# Patient Record
Sex: Male | Born: 1975
Health system: Southern US, Community
[De-identification: ages and names within clinical notes are randomized; demographics above are authoritative.]

## PROBLEM LIST (undated history)

## (undated) DIAGNOSIS — K219 Gastro-esophageal reflux disease without esophagitis: Secondary | ICD-10-CM

## (undated) DIAGNOSIS — I639 Cerebral infarction, unspecified: Secondary | ICD-10-CM

## (undated) DIAGNOSIS — E119 Type 2 diabetes mellitus without complications: Secondary | ICD-10-CM

## (undated) DIAGNOSIS — I10 Essential (primary) hypertension: Secondary | ICD-10-CM

## (undated) DIAGNOSIS — F32A Depression, unspecified: Secondary | ICD-10-CM

## (undated) HISTORY — DX: Cerebral infarction, unspecified: I63.9

## (undated) HISTORY — DX: Depression, unspecified: F32.A

## (undated) HISTORY — DX: Type 2 diabetes mellitus without complications: E11.9

## (undated) HISTORY — DX: Gastro-esophageal reflux disease without esophagitis: K21.9

---

## 1998-01-17 HISTORY — PX: OTHER SURGICAL HISTORY: SHX169

## 2016-01-18 HISTORY — PX: OTHER SURGICAL HISTORY: SHX169

## 2019-04-23 DIAGNOSIS — M25561 Pain in right knee: Secondary | ICD-10-CM | POA: Diagnosis not present

## 2019-04-23 DIAGNOSIS — M79604 Pain in right leg: Secondary | ICD-10-CM | POA: Diagnosis not present

## 2019-04-23 DIAGNOSIS — M7989 Other specified soft tissue disorders: Secondary | ICD-10-CM | POA: Diagnosis not present

## 2019-04-23 DIAGNOSIS — Z7982 Long term (current) use of aspirin: Secondary | ICD-10-CM | POA: Diagnosis not present

## 2019-04-23 DIAGNOSIS — E785 Hyperlipidemia, unspecified: Secondary | ICD-10-CM | POA: Diagnosis not present

## 2019-04-23 DIAGNOSIS — E119 Type 2 diabetes mellitus without complications: Secondary | ICD-10-CM | POA: Diagnosis not present

## 2019-04-23 DIAGNOSIS — F17210 Nicotine dependence, cigarettes, uncomplicated: Secondary | ICD-10-CM | POA: Diagnosis not present

## 2019-04-23 DIAGNOSIS — Z79899 Other long term (current) drug therapy: Secondary | ICD-10-CM | POA: Diagnosis not present

## 2019-04-23 DIAGNOSIS — R2241 Localized swelling, mass and lump, right lower limb: Secondary | ICD-10-CM | POA: Diagnosis not present

## 2019-04-23 DIAGNOSIS — I10 Essential (primary) hypertension: Secondary | ICD-10-CM | POA: Diagnosis not present

## 2019-04-23 DIAGNOSIS — Z7984 Long term (current) use of oral hypoglycemic drugs: Secondary | ICD-10-CM | POA: Diagnosis not present

## 2019-05-15 DIAGNOSIS — R079 Chest pain, unspecified: Secondary | ICD-10-CM | POA: Diagnosis not present

## 2019-05-16 DIAGNOSIS — I517 Cardiomegaly: Secondary | ICD-10-CM | POA: Diagnosis not present

## 2019-05-20 DIAGNOSIS — F331 Major depressive disorder, recurrent, moderate: Secondary | ICD-10-CM | POA: Diagnosis not present

## 2019-06-12 ENCOUNTER — Ambulatory Visit: Payer: Self-pay | Admitting: Family Medicine

## 2019-07-08 ENCOUNTER — Emergency Department (HOSPITAL_COMMUNITY): Payer: BC Managed Care – PPO

## 2019-07-08 ENCOUNTER — Inpatient Hospital Stay (HOSPITAL_COMMUNITY)
Admission: EM | Admit: 2019-07-08 | Discharge: 2019-07-09 | DRG: 062 | Discharge status: Against Medical Advice | Payer: BC Managed Care – PPO | Attending: Neurology | Admitting: Neurology

## 2019-07-08 ENCOUNTER — Inpatient Hospital Stay (HOSPITAL_COMMUNITY): Payer: BC Managed Care – PPO

## 2019-07-08 DIAGNOSIS — R4781 Slurred speech: Secondary | ICD-10-CM | POA: Diagnosis not present

## 2019-07-08 DIAGNOSIS — R519 Headache, unspecified: Secondary | ICD-10-CM | POA: Diagnosis not present

## 2019-07-08 DIAGNOSIS — T7840XA Allergy, unspecified, initial encounter: Secondary | ICD-10-CM | POA: Diagnosis not present

## 2019-07-08 DIAGNOSIS — E876 Hypokalemia: Secondary | ICD-10-CM | POA: Diagnosis not present

## 2019-07-08 DIAGNOSIS — Z20822 Contact with and (suspected) exposure to covid-19: Secondary | ICD-10-CM | POA: Diagnosis not present

## 2019-07-08 DIAGNOSIS — I639 Cerebral infarction, unspecified: Secondary | ICD-10-CM | POA: Diagnosis not present

## 2019-07-08 DIAGNOSIS — R52 Pain, unspecified: Secondary | ICD-10-CM | POA: Diagnosis not present

## 2019-07-08 DIAGNOSIS — F17210 Nicotine dependence, cigarettes, uncomplicated: Secondary | ICD-10-CM | POA: Diagnosis not present

## 2019-07-08 DIAGNOSIS — E785 Hyperlipidemia, unspecified: Secondary | ICD-10-CM | POA: Diagnosis not present

## 2019-07-08 DIAGNOSIS — G8194 Hemiplegia, unspecified affecting left nondominant side: Secondary | ICD-10-CM | POA: Diagnosis not present

## 2019-07-08 DIAGNOSIS — G4489 Other headache syndrome: Secondary | ICD-10-CM | POA: Diagnosis not present

## 2019-07-08 DIAGNOSIS — R29810 Facial weakness: Secondary | ICD-10-CM | POA: Diagnosis not present

## 2019-07-08 DIAGNOSIS — R29818 Other symptoms and signs involving the nervous system: Secondary | ICD-10-CM | POA: Diagnosis not present

## 2019-07-08 DIAGNOSIS — R000 Tachycardia, unspecified: Secondary | ICD-10-CM | POA: Diagnosis not present

## 2019-07-08 DIAGNOSIS — I10 Essential (primary) hypertension: Secondary | ICD-10-CM | POA: Diagnosis not present

## 2019-07-08 DIAGNOSIS — R9431 Abnormal electrocardiogram [ECG] [EKG]: Secondary | ICD-10-CM | POA: Diagnosis not present

## 2019-07-08 DIAGNOSIS — H538 Other visual disturbances: Secondary | ICD-10-CM | POA: Diagnosis not present

## 2019-07-08 DIAGNOSIS — E119 Type 2 diabetes mellitus without complications: Secondary | ICD-10-CM | POA: Diagnosis present

## 2019-07-08 HISTORY — DX: Essential (primary) hypertension: I10

## 2019-07-08 LAB — CBC
HCT: 45 % (ref 39.0–52.0)
Hemoglobin: 14.3 g/dL (ref 13.0–17.0)
MCH: 25.8 pg — ABNORMAL LOW (ref 26.0–34.0)
MCHC: 31.8 g/dL (ref 30.0–36.0)
MCV: 81.1 fL (ref 80.0–100.0)
Platelets: 315 10*3/uL (ref 150–400)
RBC: 5.55 MIL/uL (ref 4.22–5.81)
RDW: 15.3 % (ref 11.5–15.5)
WBC: 7.8 10*3/uL (ref 4.0–10.5)
nRBC: 0 % (ref 0.0–0.2)

## 2019-07-08 LAB — COMPREHENSIVE METABOLIC PANEL
ALT: 33 U/L (ref 0–44)
AST: 24 U/L (ref 15–41)
Albumin: 3.7 g/dL (ref 3.5–5.0)
Alkaline Phosphatase: 67 U/L (ref 38–126)
Anion gap: 11 (ref 5–15)
BUN: 9 mg/dL (ref 6–20)
CO2: 23 mmol/L (ref 22–32)
Calcium: 9 mg/dL (ref 8.9–10.3)
Chloride: 103 mmol/L (ref 98–111)
Creatinine, Ser: 1.12 mg/dL (ref 0.61–1.24)
GFR calc Af Amer: >60 mL/min (ref >60)
GFR calc non Af Amer: >60 mL/min (ref >60)
Glucose, Bld: 167 mg/dL — ABNORMAL HIGH (ref 70–99)
Potassium: 4.1 mmol/L (ref 3.5–5.1)
Sodium: 137 mmol/L (ref 135–145)
Total Bilirubin: 0.8 mg/dL (ref 0.3–1.2)
Total Protein: 6.8 g/dL (ref 6.5–8.1)

## 2019-07-08 LAB — PROTIME-INR
INR: 1 (ref 0.8–1.2)
Prothrombin Time: 12.9 seconds (ref 11.4–15.2)

## 2019-07-08 LAB — I-STAT CHEM 8, ED
BUN: 11 mg/dL (ref 6–20)
Calcium, Ion: 1.12 mmol/L — ABNORMAL LOW (ref 1.15–1.40)
Chloride: 101 mmol/L (ref 98–111)
Creatinine, Ser: 1.1 mg/dL (ref 0.61–1.24)
Glucose, Bld: 172 mg/dL — ABNORMAL HIGH (ref 70–99)
HCT: 45 % (ref 39.0–52.0)
Hemoglobin: 15.3 g/dL (ref 13.0–17.0)
Potassium: 3.3 mmol/L — ABNORMAL LOW (ref 3.5–5.1)
Sodium: 140 mmol/L (ref 135–145)
TCO2: 26 mmol/L (ref 22–32)

## 2019-07-08 LAB — CBG MONITORING, ED: Glucose-Capillary: 157 mg/dL — ABNORMAL HIGH (ref 70–99)

## 2019-07-08 LAB — DIFFERENTIAL
Abs Immature Granulocytes: 0.08 10*3/uL — ABNORMAL HIGH (ref 0.00–0.07)
Basophils Absolute: 0 10*3/uL (ref 0.0–0.1)
Basophils Relative: 1 %
Eosinophils Absolute: 0.2 10*3/uL (ref 0.0–0.5)
Eosinophils Relative: 2 %
Immature Granulocytes: 1 %
Lymphocytes Relative: 26 %
Lymphs Abs: 2 10*3/uL (ref 0.7–4.0)
Monocytes Absolute: 0.5 10*3/uL (ref 0.1–1.0)
Monocytes Relative: 7 %
Neutro Abs: 5 10*3/uL (ref 1.7–7.7)
Neutrophils Relative %: 63 %

## 2019-07-08 LAB — APTT: aPTT: 30 seconds (ref 24–36)

## 2019-07-08 LAB — ETHANOL: Alcohol, Ethyl (B): <10 mg/dL (ref <10)

## 2019-07-08 MED ORDER — STROKE: EARLY STAGES OF RECOVERY BOOK: Freq: Once | Status: DC

## 2019-07-08 MED ORDER — ACETAMINOPHEN 160 MG/5ML PO SOLN: 650.0000 mg | ORAL | Status: DC | PRN

## 2019-07-08 MED ORDER — NICARDIPINE HCL IN NACL 20-0.86 MG/200ML-% IV SOLN: 0.0000 mg/h | INTRAVENOUS | Status: DC | PRN

## 2019-07-08 MED ORDER — ALTEPLASE (STROKE) FULL DOSE INFUSION
90.0000 mg | Freq: Once | INTRAVENOUS | Status: AC
  Administered 2019-07-08: 90 mg via INTRAVENOUS
  Filled 2019-07-08: qty 100

## 2019-07-08 MED ORDER — IOHEXOL 350 MG/ML SOLN
100.0000 mL | Freq: Once | INTRAVENOUS | Status: AC | PRN
  Administered 2019-07-08: 100 mL via INTRAVENOUS

## 2019-07-08 MED ORDER — ACETAMINOPHEN 325 MG PO TABS: 650.0000 mg | ORAL_TABLET | ORAL | Status: DC | PRN

## 2019-07-08 MED ORDER — LABETALOL HCL 5 MG/ML IV SOLN: 10.0000 mg | Freq: Once | INTRAVENOUS | Status: DC | PRN

## 2019-07-08 MED ORDER — SODIUM CHLORIDE 0.9 % IV SOLN: 50.0000 mL/h | INTRAVENOUS | Status: DC

## 2019-07-08 MED ORDER — DIPHENHYDRAMINE HCL 50 MG/ML IJ SOLN
INTRAMUSCULAR | Status: AC
  Filled 2019-07-08: qty 1

## 2019-07-08 MED ORDER — ONDANSETRON HCL 4 MG/2ML IJ SOLN
INTRAMUSCULAR | Status: AC
  Filled 2019-07-08: qty 2

## 2019-07-08 MED ORDER — SODIUM CHLORIDE 0.9 % IV SOLN
50.0000 mL | Freq: Once | INTRAVENOUS | Status: AC
  Administered 2019-07-08: 50 mL via INTRAVENOUS

## 2019-07-08 MED ORDER — PANTOPRAZOLE SODIUM 40 MG IV SOLR
40.0000 mg | Freq: Every day | INTRAVENOUS | Status: DC
  Administered 2019-07-09: 40 mg via INTRAVENOUS
  Filled 2019-07-08: qty 40

## 2019-07-08 MED ORDER — DIPHENHYDRAMINE HCL 50 MG/ML IJ SOLN
50.0000 mg | Freq: Once | INTRAMUSCULAR | Status: AC
  Administered 2019-07-09: 50 mg via INTRAVENOUS

## 2019-07-08 MED ORDER — ACETAMINOPHEN 650 MG RE SUPP: 650.0000 mg | RECTAL | Status: DC | PRN

## 2019-07-08 NOTE — ED Triage Notes (Signed)
Pt presents to ED BIB [facility]. Pt presents as COD STROKE. Pt L arm and leg weakness, l facial droop, dysarthria. LKW - 2130.

## 2019-07-08 NOTE — H&P (Signed)
Neurology H&P  CC: Left-sided weakness  History is obtained from: Patient  HPI: Samuel Maldonado is a 44 y.o. male with a history of diabetes and hypertension who presents with left-sided weakness that started abruptly at 9:24 PM.  He has had a headache all day, but did not have any neurological symptoms on arriving home as witnessed by both the patient and his wife.  He called out to her that his vision had gone out in his left eye, and when his wife responded he was staggering and therefore 911 was called.  He states that the vision returned fairly quickly, but he continued to have difficulty with his left side and EMS activated a code stroke.  On arrival to the emergency department, he had mild left arm and leg weakness with drift.  On ambulation, he was able with one person assist, but was clearly unsteady.  I discussed risk and benefits of TPA with the patient, however he wanted to involve his wife.  She was called on the phone, but the discussion was fairly extended causing some delay.   LKW: 9:20 PM tpa given?:  Yes Modified Rankin Scale: 0-Completely asymptomatic and back to baseline post- stroke  ROS: A 14 point ROS was performed and is negative except as noted in the HPI.   PMH: htn DMII   FHx: No hx stroke  Social History: + for smoking, does not drink  Exam: Current vital signs: BP (!) 152/109   Pulse 90   Temp (!) 97 F (36.1 C)   Resp 15   Wt 119.9 kg   SpO2 98%  Vital signs in last 24 hours: Temp:  [97 F (36.1 C)] 97 F (36.1 C) (06/21 2316) Pulse Rate:  [88-93] 90 (06/21 2316) Resp:  [11-20] 15 (06/21 2316) BP: (139-158)/(98-109) 152/109 (06/21 2316) SpO2:  [95 %-99 %] 98 % (06/21 2316) Weight:  [119.9 kg] 119.9 kg (06/21 2241)  Physical Exam  Constitutional: Appears well-developed and well-nourished.  Psych: Affect appropriate to situation Eyes: No scleral injection HENT: No OP obstrucion Head: Normocephalic.  Cardiovascular: Normal rate and  regular rhythm.  Respiratory: Effort normal and breath sounds normal to anterior ascultation GI: Soft.  No distension. There is no tenderness.  Skin: WDI  Neuro: Mental Status: Patient is awake, alert, oriented to person, place, month, year, and situation. Patient is able to give a clear and coherent history. No signs of aphasia or neglect Cranial Nerves: II: Visual Fields are full. Pupils are equal, round, and reactive to light.   III,IV, VI: EOMI without ptosis or diploplia.  V: Facial sensation is symmetric to temperature VII: Facial movement is with mild lag on left smile VIII: hearing is intact to voice X: Uvula elevates symmetrically XI: Shoulder shrug is symmetric. XII: tongue is midline without atrophy or fasciculations.  Motor: Tone is normal. Bulk is normal. 5/5 strength was present on the right, he has drift in left arm and leg with 4/5 weakness Sensory: Sensation is symmetric to light touch and temperature in the arms and legs. Cerebellar: FNF and HKS are intact bilaterally   I have reviewed labs in epic and the results pertinent to this consultation are: cmp - unremarkable  I have reviewed the images obtained:CT/CTA - negative  Primary Diagnosis:  Cerebral Infarction due to occlusion of unspecified artery   Secondary Diagnosis: Essential (primary) hypertension and Type 2 diabetes mellitus w/o complications   Impression: 44 year old male with acute left-sided weakness.  I do wonder about thalamic infarct  given his initial complaint of visual change, however MRI could be helpful in determining location.  His improving symptoms are reassuring, but at the time of TPA administration, he still had left-sided symptoms which could impair his ability to function in his vocation (e.g. disabling symptom).   Recommendations: - HgbA1c, fasting lipid panel - MRI, MRA  of the brain without contrast - Frequent neuro checks - Echocardiogram - Carotid dopplers - Prophylactic  therapy-Antiplatelet med: None for 24 hours - Risk factor modification - Telemetry monitoring - PT consult, OT consult, Speech consult - SSI for DM - Stroke team to follow    This patient is critically ill and at significant risk of neurological worsening, death and care requires constant monitoring of vital signs, hemodynamics,respiratory and cardiac monitoring, neurological assessment, discussion with family, other specialists and medical decision making of high complexity. I spent 45 minutes of neurocritical care time  in the care of  this patient. This was time spent independent of any time provided by nurse practitioner or PA.  Roland Rack, MD [facility] [phone]  If 7pm- 7am, please page neurology on call as listed in [place]. 07/08/2019  11:27 PM

## 2019-07-08 NOTE — Progress Notes (Signed)
Pharmacist Code Stroke Response  Notified to mix tPA at 2240 by Dr. Amada Jupiter Delivered tPA to RN at 2244 Dose administered at 2246  tPA dose = 9mg  bolus over 1 minute followed by 82mg  for a total dose of 90mg  over 1 hour  Issues/delays encountered (if applicable): Patient consent   , PharmD., BCPS, BCCCP Clinical Pharmacist Clinical phone for 07/08/19 until 11:30pm: [phone] If after 11:30pm, please refer to [facility] for unit-specific pharmacist

## 2019-07-08 NOTE — ED Provider Notes (Signed)
[facility] EMERGENCY DEPARTMENT Provider Note   CSN: [id number] Arrival date & time: 07/08/19  2210  An emergency department physician performed an initial assessment on this suspected stroke patient at 2211.  History Chief Complaint  Patient presents with  . Code Stroke    Samuel Maldonado is a 44 y.o. male.  44 year old male with history of hypertension who had a headache all day on the left side behind his left ear.  Patient states that he had complete loss of vision in his left eye for 4 to 5 minutes that has since resolved but at that same time he also had slurred speech per his spouse and this is now resolved.  No other focal deficits were noted.  Airway intact.  Neurology present on patient arrival and patient taken directly to the CT scanner for further evaluation.  The history is provided by the patient and the EMS personnel.  Illness Location:  Head Quality:  Ache, vision changes, slurred speech Severity:  Moderate Onset quality:  Gradual Timing:  Constant Progression:  Unchanged Chronicity:  New Context:  All day headache follow-up with neurologic symptoms concerning for stroke brought in as a code stroke Relieved by:  Nothing Worsened by:  Nothing Ineffective treatments:  Nothing Associated symptoms: no chest pain, no loss of consciousness, no nausea, no shortness of breath and no vomiting        History reviewed. No pertinent past medical history.  Patient Active Problem List   Diagnosis Date Noted  . Stroke (cerebrum) (HCC) 07/08/2019    History reviewed. No pertinent surgical history.     History reviewed. No pertinent family history.  Social History   Tobacco Use  . Smoking status: Not on file  Substance Use Topics  . Alcohol use: Not on file  . Drug use: Not on file    Home Medications Prior to Admission medications   Medication Sig Start Date End Date Taking? Authorizing Provider  acetaminophen (TYLENOL) 325 MG  tablet Take 650 mg by mouth every 6 (six) hours as needed for mild pain.   Yes [provider]  aspirin 81 MG EC tablet Take 81 mg by mouth daily. 02/21/19  Yes [provider]  atorvastatin (LIPITOR) 20 MG tablet Take 20 mg by mouth daily. 05/01/19  Yes [provider]  glipiZIDE (GLUCOTROL) 10 MG tablet Take 10 mg by mouth daily. 05/01/19  Yes [provider]  lisinopril (ZESTRIL) 10 MG tablet Take 10 mg by mouth daily. 05/01/19  Yes [provider]  metFORMIN (GLUCOPHAGE) 500 MG tablet Take 500 mg by mouth 2 (two) times daily. 05/01/19  Yes [provider]  pseudoephedrine-acetaminophen (TYLENOL SINUS) 30-500 MG TABS tablet Take 1 tablet by mouth every 4 (four) hours as needed for congestion.   Yes [provider]  sitaGLIPtin (JANUVIA) 100 MG tablet Take 100 mg by mouth daily at 12 noon. 08/08/18 08/08/19 Yes [provider]  tadalafil (CIALIS) 20 MG tablet Take 20 mg by mouth daily as needed for erectile dysfunction.   Yes [provider]    Allergies    Patient has no known allergies.  Review of Systems   Review of Systems  Eyes: Positive for visual disturbance.  Respiratory: Negative for shortness of breath.   Cardiovascular: Negative for chest pain.  Gastrointestinal: Negative for nausea and vomiting.  Neurological: Positive for speech difficulty and numbness. Negative for loss of consciousness.  All other systems reviewed and are negative.   Physical Exam  Updated Vital Signs BP (!) 152/101   Pulse (!) 101   Temp 98.4 F (36.9 C) (Oral)   Resp 20   Wt 119.9 kg   SpO2 96%   Physical Exam Vitals and nursing note reviewed.  Constitutional:      Appearance: He is well-developed.  HENT:     Head: Normocephalic and atraumatic.     Right Ear: External ear normal.     Left Ear: External ear normal.     Nose: Nose normal.  Eyes:     Extraocular Movements: Extraocular movements intact.      Conjunctiva/sclera: Conjunctivae normal.  Cardiovascular:     Rate and Rhythm: Normal rate and regular rhythm.     Heart sounds: No murmur heard.   Pulmonary:     Effort: Pulmonary effort is normal. No respiratory distress.     Breath sounds: Normal breath sounds.  Abdominal:     Palpations: Abdomen is soft.     Tenderness: There is no abdominal tenderness.  Musculoskeletal:     Cervical back: Neck supple.  Skin:    General: Skin is warm and dry.  Neurological:     General: No focal deficit present.     Mental Status: He is alert and oriented to person, place, and time.     ED Results / Procedures / Treatments   Labs (all labs ordered are listed, but only abnormal results are displayed) Labs Reviewed  CBC - Abnormal; Notable for the following components:      Result Value   MCH 25.8 (*)    All other components within normal limits  DIFFERENTIAL - Abnormal; Notable for the following components:   Abs Immature Granulocytes 0.08 (*)    All other components within normal limits  COMPREHENSIVE METABOLIC PANEL - Abnormal; Notable for the following components:   Glucose, Bld 167 (*)    All other components within normal limits  URINALYSIS, ROUTINE W REFLEX MICROSCOPIC - Abnormal; Notable for the following components:   Hgb urine dipstick SMALL (*)    Protein, ur 30 (*)    All other components within normal limits  CBC - Abnormal; Notable for the following components:   RBC 5.82 (*)    MCH 25.9 (*)    All other components within normal limits  LIPID PANEL - Abnormal; Notable for the following components:   HDL 39 (*)    LDL Cholesterol 103 (*)    All other components within normal limits  I-STAT CHEM 8, ED - Abnormal; Notable for the following components:   Potassium 3.3 (*)    Glucose, Bld 172 (*)    Calcium, Ion 1.12 (*)    All other components within normal limits  CBG MONITORING, ED - Abnormal; Notable for the following components:   Glucose-Capillary 157 (*)    All  other components within normal limits  SARS CORONAVIRUS 2 BY RT PCR (HOSPITAL ORDER, PERFORMED IN [facility])  MRSA PCR SCREENING  ETHANOL  PROTIME-INR  APTT  RAPID URINE DRUG SCREEN, HOSP PERFORMED  HIV ANTIBODY (ROUTINE TESTING W REFLEX)  HEMOGLOBIN A1C    EKG EKG Interpretation  Date/Time:  Monday July 08 2019 22:40:23 EDT Ventricular Rate:  94 PR Interval:    QRS Duration: 88 QT Interval:  360 QTC Calculation: 451 R Axis:   60 Text Interpretation: Sinus rhythm Nonspecific T wave abnormality No old tracing to compare Confirmed by Dione Booze ([id number]) on 07/09/2019 12:21:17 AM   Radiology CT Code Stroke CTA  Head W/WO contrast  Result Date: 07/08/2019 CLINICAL DATA:  Slurred speech and headache. Left-sided vision disturbance. Code stroke. EXAM: CT HEAD WITHOUT CONTRAST CT ANGIOGRAPHY OF THE HEAD AND NECK TECHNIQUE: Contiguous axial images were obtained from the base of the skull through the vertex without intravenous contrast. Multidetector CT imaging of the head and neck was performed using the standard protocol during bolus administration of intravenous contrast. Multiplanar CT image reconstructions and MIPs were obtained to evaluate the vascular anatomy. Carotid stenosis measurements (when applicable) are obtained utilizing NASCET criteria, using the distal internal carotid diameter as the denominator. COMPARISON:  None. FINDINGS: CT HEAD FINDINGS Brain: There is no mass, hemorrhage or extra-axial collection. The size and configuration of the ventricles and extra-axial CSF spaces are normal. The brain parenchyma is normal, without evidence of acute or chronic infarction. Vascular: No abnormal hyperdensity of the major intracranial arteries or dural venous sinuses. No intracranial atherosclerosis. Skull: The visualized skull base, calvarium and extracranial soft tissues are normal. Sinuses/Orbits: No fluid levels or advanced mucosal thickening of the visualized paranasal  sinuses. No mastoid or middle ear effusion. The orbits are normal. ASPECTS ([place] Stroke Program Early CT Score) - Ganglionic level infarction (caudate, lentiform nuclei, internal capsule, insula, M1-M3 cortex): 7 - Supraganglionic infarction (M4-M6 cortex): 3 Total score (0-10 with 10 being normal): 10 CTA NECK FINDINGS SKELETON: There is no bony spinal canal stenosis. No lytic or blastic lesion. OTHER NECK: Normal pharynx, larynx and major salivary glands. No cervical lymphadenopathy. Unremarkable thyroid gland. UPPER CHEST: No pneumothorax or pleural effusion. No nodules or masses. AORTIC ARCH: There is no calcific atherosclerosis of the aortic arch. There is no aneurysm, dissection or hemodynamically significant stenosis of the visualized portion of the aorta. Conventional 3 vessel aortic branching pattern. The visualized proximal subclavian arteries are widely patent. RIGHT CAROTID SYSTEM: Normal without aneurysm, dissection or stenosis. LEFT CAROTID SYSTEM: Normal without aneurysm, dissection or stenosis. VERTEBRAL ARTERIES: Left dominant configuration. Both origins are clearly patent. There is no dissection, occlusion or flow-limiting stenosis to the skull base (V1-V3 segments). CTA HEAD FINDINGS POSTERIOR CIRCULATION: --Vertebral arteries: Normal V4 segments. --Inferior cerebellar arteries: Normal. --Basilar artery: Normal. --Superior cerebellar arteries: Normal. --Posterior cerebral arteries (PCA): Normal. ANTERIOR CIRCULATION: --Intracranial internal carotid arteries: Normal. --Anterior cerebral arteries (ACA): Normal. Both A1 segments are present. Patent anterior communicating artery (a-comm). --Middle cerebral arteries (MCA): Normal. VENOUS SINUSES: As permitted by contrast timing, patent. ANATOMIC VARIANTS: None Review of the MIP images confirms the above findings. IMPRESSION: 1. No intracranial hemorrhage. ASPECTS is 10. 2. No emergent large vessel occlusion or high-grade stenosis of the head or  neck. These results were communicated to Dr. Ritta Slot at 10:42 pm on 07/08/2019 by text page via the [facility] messaging system. Electronically Signed   By: Deatra Robinson M.D.   On: 07/08/2019 22:41   CT Head Wo Contrast  Result Date: 07/09/2019 CLINICAL DATA:  Status post tPA.  New onset blurry vision. EXAM: CT HEAD WITHOUT CONTRAST TECHNIQUE: Contiguous axial images were obtained from the base of the skull through the vertex without intravenous contrast. COMPARISON:  None. FINDINGS: Brain: There is no mass, hemorrhage or extra-axial collection. The size and configuration of the ventricles and extra-axial CSF spaces are normal. The brain parenchyma is normal, without acute or chronic infarction. 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 Skull: The visualized skull base, calvarium and extracranial soft tissues are normal. Sinuses/Orbits: No fluid levels or advanced mucosal thickening of the visualized paranasal sinuses. No mastoid  or middle ear effusion. The orbits are normal. IMPRESSION: Normal head CT. Incidentally noted right cerebellar developmental venous anomaly. Electronically Signed   By: Deatra RobinsonKevin  Herman M.D.   On: 07/09/2019 00:22   CT Code Stroke CTA Neck W/WO contrast  Result Date: 07/08/2019 CLINICAL DATA:  Slurred speech and headache. Left-sided vision disturbance. Code stroke. EXAM: CT HEAD WITHOUT CONTRAST CT ANGIOGRAPHY OF THE HEAD AND NECK TECHNIQUE: Contiguous axial images were obtained from the base of the skull through the vertex without intravenous contrast. Multidetector CT imaging of the head and neck was performed using the standard protocol during bolus administration of intravenous contrast. Multiplanar CT image reconstructions and MIPs were obtained to evaluate the vascular anatomy. Carotid stenosis measurements (when applicable) are obtained utilizing NASCET criteria, using the distal internal carotid diameter as the denominator. COMPARISON:  None.  FINDINGS: CT HEAD FINDINGS Brain: There is no mass, hemorrhage or extra-axial collection. The size and configuration of the ventricles and extra-axial CSF spaces are normal. The brain parenchyma is normal, without evidence of acute or chronic infarction. Vascular: No abnormal hyperdensity of the major intracranial arteries or dural venous sinuses. No intracranial atherosclerosis. Skull: The visualized skull base, calvarium and extracranial soft tissues are normal. Sinuses/Orbits: No fluid levels or advanced mucosal thickening of the visualized paranasal sinuses. No mastoid or middle ear effusion. The orbits are normal. ASPECTS ([place] Stroke Program Early CT Score) - Ganglionic level infarction (caudate, lentiform nuclei, internal capsule, insula, M1-M3 cortex): 7 - Supraganglionic infarction (M4-M6 cortex): 3 Total score (0-10 with 10 being normal): 10 CTA NECK FINDINGS SKELETON: There is no bony spinal canal stenosis. No lytic or blastic lesion. OTHER NECK: Normal pharynx, larynx and major salivary glands. No cervical lymphadenopathy. Unremarkable thyroid gland. UPPER CHEST: No pneumothorax or pleural effusion. No nodules or masses. AORTIC ARCH: There is no calcific atherosclerosis of the aortic arch. There is no aneurysm, dissection or hemodynamically significant stenosis of the visualized portion of the aorta. Conventional 3 vessel aortic branching pattern. The visualized proximal subclavian arteries are widely patent. RIGHT CAROTID SYSTEM: Normal without aneurysm, dissection or stenosis. LEFT CAROTID SYSTEM: Normal without aneurysm, dissection or stenosis. VERTEBRAL ARTERIES: Left dominant configuration. Both origins are clearly patent. There is no dissection, occlusion or flow-limiting stenosis to the skull base (V1-V3 segments). CTA HEAD FINDINGS POSTERIOR CIRCULATION: --Vertebral arteries: Normal V4 segments. --Inferior cerebellar arteries: Normal. --Basilar artery: Normal. --Superior cerebellar arteries:  Normal. --Posterior cerebral arteries (PCA): Normal. ANTERIOR CIRCULATION: --Intracranial internal carotid arteries: Normal. --Anterior cerebral arteries (ACA): Normal. Both A1 segments are present. Patent anterior communicating artery (a-comm). --Middle cerebral arteries (MCA): Normal. VENOUS SINUSES: As permitted by contrast timing, patent. ANATOMIC VARIANTS: None Review of the MIP images confirms the above findings. IMPRESSION: 1. No intracranial hemorrhage. ASPECTS is 10. 2. No emergent large vessel occlusion or high-grade stenosis of the head or neck. These results were communicated to Dr. Ritta SlotMcNeill Kirkpatrick at 10:42 pm on 07/08/2019 by text page via the [facility] messaging system. Electronically Signed   By: Deatra RobinsonKevin  Herman M.D.   On: 07/08/2019 22:41   CT HEAD CODE STROKE WO CONTRAST  Result Date: 07/08/2019 CLINICAL DATA:  Slurred speech and headache. Left-sided vision disturbance. Code stroke. EXAM: CT HEAD WITHOUT CONTRAST CT ANGIOGRAPHY OF THE HEAD AND NECK TECHNIQUE: Contiguous axial images were obtained from the base of the skull through the vertex without intravenous contrast. Multidetector CT imaging of the head and neck was performed using the standard protocol during bolus administration of intravenous contrast. Multiplanar CT image  reconstructions and MIPs were obtained to evaluate the vascular anatomy. Carotid stenosis measurements (when applicable) are obtained utilizing NASCET criteria, using the distal internal carotid diameter as the denominator. COMPARISON:  None. FINDINGS: CT HEAD FINDINGS Brain: There is no mass, hemorrhage or extra-axial collection. The size and configuration of the ventricles and extra-axial CSF spaces are normal. The brain parenchyma is normal, without evidence of acute or chronic infarction. Vascular: No abnormal hyperdensity of the major intracranial arteries or dural venous sinuses. No intracranial atherosclerosis. Skull: The visualized skull base, calvarium and  extracranial soft tissues are normal. Sinuses/Orbits: No fluid levels or advanced mucosal thickening of the visualized paranasal sinuses. No mastoid or middle ear effusion. The orbits are normal. ASPECTS ([place] Stroke Program Early CT Score) - Ganglionic level infarction (caudate, lentiform nuclei, internal capsule, insula, M1-M3 cortex): 7 - Supraganglionic infarction (M4-M6 cortex): 3 Total score (0-10 with 10 being normal): 10 CTA NECK FINDINGS SKELETON: There is no bony spinal canal stenosis. No lytic or blastic lesion. OTHER NECK: Normal pharynx, larynx and major salivary glands. No cervical lymphadenopathy. Unremarkable thyroid gland. UPPER CHEST: No pneumothorax or pleural effusion. No nodules or masses. AORTIC ARCH: There is no calcific atherosclerosis of the aortic arch. There is no aneurysm, dissection or hemodynamically significant stenosis of the visualized portion of the aorta. Conventional 3 vessel aortic branching pattern. The visualized proximal subclavian arteries are widely patent. RIGHT CAROTID SYSTEM: Normal without aneurysm, dissection or stenosis. LEFT CAROTID SYSTEM: Normal without aneurysm, dissection or stenosis. VERTEBRAL ARTERIES: Left dominant configuration. Both origins are clearly patent. There is no dissection, occlusion or flow-limiting stenosis to the skull base (V1-V3 segments). CTA HEAD FINDINGS POSTERIOR CIRCULATION: --Vertebral arteries: Normal V4 segments. --Inferior cerebellar arteries: Normal. --Basilar artery: Normal. --Superior cerebellar arteries: Normal. --Posterior cerebral arteries (PCA): Normal. ANTERIOR CIRCULATION: --Intracranial internal carotid arteries: Normal. --Anterior cerebral arteries (ACA): Normal. Both A1 segments are present. Patent anterior communicating artery (a-comm). --Middle cerebral arteries (MCA): Normal. VENOUS SINUSES: As permitted by contrast timing, patent. ANATOMIC VARIANTS: None Review of the MIP images confirms the above findings.  IMPRESSION: 1. No intracranial hemorrhage. ASPECTS is 10. 2. No emergent large vessel occlusion or high-grade stenosis of the head or neck. These results were communicated to Dr. Roland Rack at 10:42 pm on 07/08/2019 by text page via the [facility] messaging system. Electronically Signed   By: Ulyses Jarred M.D.   On: 07/08/2019 22:41    Procedures Procedures (including critical care time)  Medications Ordered in ED Medications  ondansetron (ZOFRAN) 4 MG/2ML injection (  Given 07/08/19 2218)  iohexol (OMNIPAQUE) 350 MG/ML injection 100 mL (100 mLs Intravenous Contrast Given 07/08/19 2230)  alteplase (ACTIVASE) 1 mg/mL infusion 90 mg (0 mg Intravenous Stopped 07/08/19 2340)    Followed by  0.9 %  sodium chloride infusion ( Intravenous Rate/Dose Verify 07/09/19 0300)  diphenhydrAMINE (BENADRYL) injection 50 mg (50 mg Intravenous Given 07/09/19 0008)  famotidine (PEPCID) IVPB 20 mg premix (0 mg Intravenous Stopped 07/09/19 0049)  methylPREDNISolone sodium succinate (SOLU-MEDROL) 125 mg/2 mL injection 125 mg (125 mg Intravenous Given 07/09/19 0016)    ED Course  I have reviewed the triage vital signs and the nursing notes.  Pertinent labs & imaging results that were available during my care of the patient were reviewed by me and considered in my medical decision making (see chart for details).    MDM Rules/Calculators/A&P  Differential diagnosis: Ischemic stroke, hypertensive emergency, vision change, allergy, anaphylaxis  ED physician interpretation of EKG: No STEMI.  Sinus rhythm ED physician interpretation of labs: Code stroke labs without critical values requiring emergency medical intervention  MDM: Patient is a 44 year old male presented to ED with left-sided weakness concerning for ischemic stroke evaluated by neurology as a code stroke with CT studies done and TPA given with improvement of symptoms followed by hives and allergic reaction symptoms in the right  arm where patient received TPA, the symptoms improved with steroids, Benadryl and Pepcid patient admitted to the neurology service for further treatment evaluation.  Patient's vital signs stable, patient afebrile.  Patient's physical exam initially unremarkable, neurology had some left-sided weakness on exam.  Based on these findings TPA was ordered by neurology.  Patient reassessed after concerns for allergic reaction.  Does not appear to be anaphylaxis as patient has no airway involvement, nausea or vomiting.  Most likely patient had allergic reaction to TPA that resolved with IV medications.  Diagnosis, treatment and plan of care was discussed and agreed upon with patient.  Patient comfortable with admission at this time.  Final Clinical Impression(s) / ED Diagnoses Final diagnoses:  Acute ischemic stroke (HCC)  Allergic reaction, initial encounter    Rx / DC Orders ED Discharge Orders    None       Janeece Fitting, MD 07/09/19 1337    Jacalyn Lefevre, MD 07/09/19 [phone]

## 2019-07-09 ENCOUNTER — Encounter (HOSPITAL_COMMUNITY): Payer: Self-pay | Admitting: Neurology

## 2019-07-09 ENCOUNTER — Inpatient Hospital Stay (HOSPITAL_COMMUNITY): Payer: BC Managed Care – PPO

## 2019-07-09 DIAGNOSIS — I639 Cerebral infarction, unspecified: Principal | ICD-10-CM

## 2019-07-09 LAB — CBC
HCT: 47.1 % (ref 39.0–52.0)
Hemoglobin: 15.1 g/dL (ref 13.0–17.0)
MCH: 25.9 pg — ABNORMAL LOW (ref 26.0–34.0)
MCHC: 32.1 g/dL (ref 30.0–36.0)
MCV: 80.9 fL (ref 80.0–100.0)
Platelets: 362 10*3/uL (ref 150–400)
RBC: 5.82 MIL/uL — ABNORMAL HIGH (ref 4.22–5.81)
RDW: 15.4 % (ref 11.5–15.5)
WBC: 8.2 10*3/uL (ref 4.0–10.5)
nRBC: 0 % (ref 0.0–0.2)

## 2019-07-09 LAB — URINALYSIS, ROUTINE W REFLEX MICROSCOPIC
Bacteria, UA: NONE SEEN
Bilirubin Urine: NEGATIVE
Glucose, UA: NEGATIVE mg/dL
Ketones, ur: NEGATIVE mg/dL
Leukocytes,Ua: NEGATIVE
Nitrite: NEGATIVE
Protein, ur: 30 mg/dL — AB
Specific Gravity, Urine: 1.027 (ref 1.005–1.030)
pH: 6 (ref 5.0–8.0)

## 2019-07-09 LAB — HIV ANTIBODY (ROUTINE TESTING W REFLEX): HIV Screen 4th Generation wRfx: NONREACTIVE

## 2019-07-09 LAB — RAPID URINE DRUG SCREEN, HOSP PERFORMED
Amphetamines: NOT DETECTED
Barbiturates: NOT DETECTED
Benzodiazepines: NOT DETECTED
Cocaine: NOT DETECTED
Opiates: NOT DETECTED
Tetrahydrocannabinol: NOT DETECTED

## 2019-07-09 LAB — MRSA PCR SCREENING: MRSA by PCR: NEGATIVE

## 2019-07-09 LAB — LIPID PANEL
Cholesterol: 161 mg/dL (ref 0–200)
HDL: 39 mg/dL — ABNORMAL LOW (ref >40)
LDL Cholesterol: 103 mg/dL — ABNORMAL HIGH (ref 0–99)
Total CHOL/HDL Ratio: 4.1 RATIO
Triglycerides: 93 mg/dL (ref <150)
VLDL: 19 mg/dL (ref 0–40)

## 2019-07-09 LAB — SARS CORONAVIRUS 2 BY RT PCR (HOSPITAL ORDER, PERFORMED IN ~~LOC~~ HOSPITAL LAB): SARS Coronavirus 2: NEGATIVE

## 2019-07-09 MED ORDER — METHYLPREDNISOLONE SODIUM SUCC 125 MG IJ SOLR
125.0000 mg | Freq: Once | INTRAMUSCULAR | Status: AC
  Administered 2019-07-09: 125 mg via INTRAVENOUS

## 2019-07-09 MED ORDER — FAMOTIDINE IN NACL 20-0.9 MG/50ML-% IV SOLN
20.0000 mg | Freq: Once | INTRAVENOUS | Status: AC
  Administered 2019-07-09: 20 mg via INTRAVENOUS
  Filled 2019-07-09: qty 50

## 2019-07-09 MED ORDER — CHLORHEXIDINE GLUCONATE CLOTH 2 % EX PADS: 6.0000 | MEDICATED_PAD | Freq: Every day | CUTANEOUS | Status: DC

## 2019-07-09 NOTE — Progress Notes (Signed)
After IV contrast, the patient developed nausea and vomited.  After tPA, the patient developed mouth tingling, slight lip swelling, and hives in the antecubital fossa above where the IV TPA infusing.  I am not sure whether the hives/lip swelling are due to TPA versus contrast, but the timing fits slightly better with TPA and the hives are on the side where the TPA was injected, but not on the side where the contrast was injected.  He was given Benadryl 50 mg, Solu-Medrol 125 mg and appears to be improving.  Ritta Slot, MD [facility] [phone]  If 7pm- 7am, please page neurology on call as listed in [facility].

## 2019-07-09 NOTE — Progress Notes (Addendum)
Pt adamant about leaving AMA, Dr. Pearlean Brownie paged and came to the bedside where he had an indepth discussion about the significance of his admission and importance of completing the stroke workup.  Pt refusing to wear heart monitor and BP cuff, refusing to stay in his room.  Pt verbally aggressive and cursing and walking down the hall.  AMA paperwork at bedside and patient will not sign.

## 2019-07-09 NOTE — Procedures (Signed)
Echo attempted. Patient not in room at time of attempt.

## 2019-07-09 NOTE — Progress Notes (Signed)
Pt left his room and returned escorted by security to sign AMA paperwork and have IVs removed prior to leaving.  AMA paperwork signed and in the chart, Dr. Pearlean Brownie notified.

## 2019-07-09 NOTE — Progress Notes (Signed)
PT Cancellation Note  Patient Details Name: Raynell Scott MRN: [id number] DOB: [birth year]   Cancelled Treatment:    Reason Eval/Treat Not Completed: Other (comment) Arrived for PT eval, pt standing getting IV's out and security present to escort pt for d/c AMA.  Will sign off.   Elray Mcgregor 07/09/2019, 2:06 PM  Sheran Lawless, PT Acute Rehabilitation Services Pager:[phone] [phone] 07/09/2019

## 2019-07-09 NOTE — Progress Notes (Signed)
Pt's belongings include shoes, clothing, phone and a wallet. His wife is taking his wallet and clothing home and the phone will remain with the patient.   Patient also notified RN about metal rod within the right arm when educating about MRI

## 2019-07-09 NOTE — Progress Notes (Signed)
Occupational Therapy Evaluation Patient Details Name: Samuel Maldonado MRN: [id number] DOB: 1975/07/08 Today's Date: 07/09/2019    History of Present Illness Samuel Maldonado is a 44 y.o. male with a history of diabetes and hypertension who presents with left-sided weakness. CT head was negative for acute abnormality.   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 Pt currently pleasant with this therapist. Pt able to recall "BEFAST" stroke symptoms well. Pt modified independence for ADL in standing and for mobility in hallway. Pt performed simulation of tub transfer with no difficulty and no LOB episodes. No focal deficits noted. OTR letting pt know that PT wanted to see pt for stairs. Pt does not require continued OT skilled services. OT signing off.    Follow Up Recommendations  No OT follow up    Equipment Recommendations  None recommended by OT    Recommendations for Other Services       Precautions / Restrictions Precautions Precautions: None Restrictions Weight Bearing Restrictions: No      Mobility Bed Mobility Overal bed mobility: Independent                Transfers Overall transfer level: Independent                    Balance Overall balance assessment: Independent                                         ADL either performed or assessed with clinical judgement   ADL Overall ADL's : Modified independent                                       General ADL Comments: Pt standing at sink for light ADL, pt simulating tub transfer and performing transfer to commode. Pt ambulatory in room and hallway with no LOB episodes. Pt's sensation, strength and vision are all WNLs.      Vision Baseline Vision/History: No visual deficits Patient Visual Report: No change from baseline Vision Assessment?: No apparent visual deficits     Perception     Praxis      Pertinent  Vitals/Pain Pain Assessment: No/denies pain     Hand Dominance Right   Extremity/Trunk Assessment Upper Extremity Assessment Upper Extremity Assessment: Overall WFL for tasks assessed   Lower Extremity Assessment Lower Extremity Assessment: Overall WFL for tasks assessed   Cervical / Trunk Assessment Cervical / Trunk Assessment: Normal   Communication Communication Communication: No difficulties   Cognition Arousal/Alertness: Awake/alert Behavior During Therapy: WFL for tasks assessed/performed Overall Cognitive Status: Within Functional Limits for tasks assessed                                 General Comments: assist with recall of 3 items. Pt reporting "I don't usually remember things well."   General Comments  Pt had telemetry off and was awaiting his spouse to assist with bathing task. Pt pleasant with this therapist. Pt able to recall "BEFAST" stroke symptoms well.    Exercises     Shoulder Instructions      Home Living Family/patient expects to be discharged to:: Private residence Living Arrangements: Spouse/significant other;Children Available Help at Discharge: Family;Available PRN/intermittently  Type of Home: House Home Access: Stairs to enter [employer] of Steps: 12 Entrance Stairs-Rails: Left Home Layout: One level     Bathroom Shower/Tub: Teacher, early years/pre: Standard     Home Equipment: None          Prior Functioning/Environment Level of Independence: Independent        Comments: truck driver        OT Problem List: Decreased cognition      OT Treatment/Interventions:      OT Goals(Current goals can be found in the care plan section) Acute Rehab OT Goals Patient Stated Goal: to get out of here  OT Frequency:     Barriers to D/C:            Co-evaluation              AM-PAC OT "6 Clicks" Daily Activity     Outcome Measure Help from another person eating meals?: None Help from  another person taking care of personal grooming?: None Help from another person toileting, which includes using toliet, bedpan, or urinal?: None Help from another person bathing (including washing, rinsing, drying)?: None Help from another person to put on and taking off regular upper body clothing?: None Help from another person to put on and taking off regular lower body clothing?: None 6 Click Score: 24   End of Session Nurse Communication: Mobility status  Activity Tolerance: Patient tolerated treatment well Patient left: in chair;with call bell/phone within reach                   Time: 1235-1255 OT Time Calculation (min): 20 min Charges:  OT General Charges $OT Visit: 1 Visit OT Evaluation $OT Eval Moderate Complexity: 1 Mod  Samuel Maldonado, OTR/L Acute Rehabilitation Services Pager: [phone] Office: [phone]   Samuel Maldonado C 07/09/2019, 2:31 PM

## 2019-07-09 NOTE — Progress Notes (Signed)
STROKE TEAM PROGRESS NOTE   INTERVAL HISTORY I have personally reviewed history of presenting illness with the patient, electronic medical records and imaging films in PACS.  He received IV TPA and has obtained substantial improvement.  Blood pressure adequately controlled.  Patient appears restless and wants to go home.  I spoke to him at length and counseled him the need for neurological monitoring given TPA and the risk of hemorrhage and the need to complete stroke risk ratification work-up to lower his chances of future strokes  Vitals:   07/09/19 0630 07/09/19 0700 07/09/19 0800 07/09/19 0900  BP:  116/82 112/88 (!) 144/91  Pulse: 90 91 88 90  Resp: 12 11 11 13   Temp:   97.9 F (36.6 C)   TempSrc:   Oral   SpO2: 94% 95% 94% 96%  Weight:        CBC:  Recent Labs  Lab 07/08/19 2215 07/08/19 2215 07/08/19 2219 07/09/19 0535  WBC 7.8  --   --  8.2  NEUTROABS 5.0  --   --   --   HGB 14.3   < > 15.3 15.1  HCT 45.0   < > 45.0 47.1  MCV 81.1  --   --  80.9  PLT 315  --   --  362   < > = values in this interval not displayed.    Basic Metabolic Panel:  Recent Labs  Lab 07/08/19 2215 07/08/19 2219  NA 137 140  K 4.1 3.3*  CL 103 101  CO2 23  --   GLUCOSE 167* 172*  BUN 9 11  CREATININE 1.12 1.10  CALCIUM 9.0  --    Lipid Panel:     Component Value Date/Time   CHOL 161 07/09/2019 0535   TRIG 93 07/09/2019 0535   HDL 39 (L) 07/09/2019 0535   CHOLHDL 4.1 07/09/2019 0535   VLDL 19 07/09/2019 0535   LDLCALC 103 (H) 07/09/2019 0535   HgbA1c: No results found for: HGBA1C Urine Drug Screen:     Component Value Date/Time   LABOPIA NONE DETECTED 07/09/2019 0207   COCAINSCRNUR NONE DETECTED 07/09/2019 0207   LABBENZ NONE DETECTED 07/09/2019 0207   AMPHETMU NONE DETECTED 07/09/2019 0207   THCU NONE DETECTED 07/09/2019 0207   LABBARB NONE DETECTED 07/09/2019 0207    Alcohol Level     Component Value Date/Time   ETH <10 07/08/2019 2215    IMAGING past 24  hours CT Code Stroke CTA Head W/WO contrast  Result Date: 07/08/2019 CLINICAL DATA:  Slurred speech and headache. Left-sided vision disturbance. Code stroke. EXAM: CT HEAD WITHOUT CONTRAST CT ANGIOGRAPHY OF THE HEAD AND NECK TECHNIQUE: Contiguous axial images were obtained from the base of the skull through the vertex without intravenous contrast. Multidetector CT imaging of the head and neck was performed using the standard protocol during bolus administration of intravenous contrast. Multiplanar CT image reconstructions and MIPs were obtained to evaluate the vascular anatomy. Carotid stenosis measurements (when applicable) are obtained utilizing NASCET criteria, using the distal internal carotid diameter as the denominator. COMPARISON:  None. FINDINGS: CT HEAD FINDINGS Brain: There is no mass, hemorrhage or extra-axial collection. The size and configuration of the ventricles and extra-axial CSF spaces are normal. The brain parenchyma is normal, without evidence of acute or chronic infarction. Vascular: No abnormal hyperdensity of the major intracranial arteries or dural venous sinuses. No intracranial atherosclerosis. Skull: The visualized skull base, calvarium and extracranial soft tissues are normal. Sinuses/Orbits: No fluid levels or  advanced mucosal thickening of the visualized paranasal sinuses. No mastoid or middle ear effusion. The orbits are normal. ASPECTS (Alberta Stroke Program Early CT Score) - Ganglionic level infarction (caudate, lentiform nuclei, internal capsule, insula, M1-M3 cortex): 7 - Supraganglionic infarction (M4-M6 cortex): 3 Total score (0-10 with 10 being normal): 10 CTA NECK FINDINGS SKELETON: There is no bony spinal canal stenosis. No lytic or blastic lesion. OTHER NECK: Normal pharynx, larynx and major salivary glands. No cervical lymphadenopathy. Unremarkable thyroid gland. UPPER CHEST: No pneumothorax or pleural effusion. No nodules or masses. AORTIC ARCH: There is no calcific  atherosclerosis of the aortic arch. There is no aneurysm, dissection or hemodynamically significant stenosis of the visualized portion of the aorta. Conventional 3 vessel aortic branching pattern. The visualized proximal subclavian arteries are widely patent. RIGHT CAROTID SYSTEM: Normal without aneurysm, dissection or stenosis. LEFT CAROTID SYSTEM: Normal without aneurysm, dissection or stenosis. VERTEBRAL ARTERIES: Left dominant configuration. Both origins are clearly patent. There is no dissection, occlusion or flow-limiting stenosis to the skull base (V1-V3 segments). CTA HEAD FINDINGS POSTERIOR CIRCULATION: --Vertebral arteries: Normal V4 segments. --Inferior cerebellar arteries: Normal. --Basilar artery: Normal. --Superior cerebellar arteries: Normal. --Posterior cerebral arteries (PCA): Normal. ANTERIOR CIRCULATION: --Intracranial internal carotid arteries: Normal. --Anterior cerebral arteries (ACA): Normal. Both A1 segments are present. Patent anterior communicating artery (a-comm). --Middle cerebral arteries (MCA): Normal. VENOUS SINUSES: As permitted by contrast timing, patent. ANATOMIC VARIANTS: None Review of the MIP images confirms the above findings. IMPRESSION: 1. No intracranial hemorrhage. ASPECTS is 10. 2. No emergent large vessel occlusion or high-grade stenosis of the head or neck. These results were communicated to Dr. Ritta SlotMcNeill Kirkpatrick at 10:42 pm on 07/08/2019 by text page via the [facility] messaging system. Electronically Signed   By: Deatra RobinsonKevin  Herman M.D.   On: 07/08/2019 22:41   CT Head Wo Contrast  Result Date: 07/09/2019 CLINICAL DATA:  Status post tPA.  New onset blurry vision. EXAM: CT HEAD WITHOUT CONTRAST TECHNIQUE: Contiguous axial images were obtained from the base of the skull through the vertex without intravenous contrast. COMPARISON:  None. FINDINGS: Brain: There is no mass, hemorrhage or extra-axial collection. The size and configuration of the ventricles and extra-axial CSF  spaces are normal. The brain parenchyma is normal, without acute or chronic infarction. 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 Skull: The visualized skull base, calvarium and extracranial soft tissues are normal. Sinuses/Orbits: No fluid levels or advanced mucosal thickening of the visualized paranasal sinuses. No mastoid or middle ear effusion. The orbits are normal. IMPRESSION: Normal head CT. Incidentally noted right cerebellar developmental venous anomaly. Electronically Signed   By: Deatra RobinsonKevin  Herman M.D.   On: 07/09/2019 00:22   CT Code Stroke CTA Neck W/WO contrast  Result Date: 07/08/2019 CLINICAL DATA:  Slurred speech and headache. Left-sided vision disturbance. Code stroke. EXAM: CT HEAD WITHOUT CONTRAST CT ANGIOGRAPHY OF THE HEAD AND NECK TECHNIQUE: Contiguous axial images were obtained from the base of the skull through the vertex without intravenous contrast. Multidetector CT imaging of the head and neck was performed using the standard protocol during bolus administration of intravenous contrast. Multiplanar CT image reconstructions and MIPs were obtained to evaluate the vascular anatomy. Carotid stenosis measurements (when applicable) are obtained utilizing NASCET criteria, using the distal internal carotid diameter as the denominator. COMPARISON:  None. FINDINGS: CT HEAD FINDINGS Brain: There is no mass, hemorrhage or extra-axial collection. The size and configuration of the ventricles and extra-axial CSF spaces are normal. The brain parenchyma is normal, without  evidence of acute or chronic infarction. Vascular: No abnormal hyperdensity of the major intracranial arteries or dural venous sinuses. No intracranial atherosclerosis. Skull: The visualized skull base, calvarium and extracranial soft tissues are normal. Sinuses/Orbits: No fluid levels or advanced mucosal thickening of the visualized paranasal sinuses. No mastoid or middle ear effusion. The orbits are normal.  ASPECTS (Alberta Stroke Program Early CT Score) - Ganglionic level infarction (caudate, lentiform nuclei, internal capsule, insula, M1-M3 cortex): 7 - Supraganglionic infarction (M4-M6 cortex): 3 Total score (0-10 with 10 being normal): 10 CTA NECK FINDINGS SKELETON: There is no bony spinal canal stenosis. No lytic or blastic lesion. OTHER NECK: Normal pharynx, larynx and major salivary glands. No cervical lymphadenopathy. Unremarkable thyroid gland. UPPER CHEST: No pneumothorax or pleural effusion. No nodules or masses. AORTIC ARCH: There is no calcific atherosclerosis of the aortic arch. There is no aneurysm, dissection or hemodynamically significant stenosis of the visualized portion of the aorta. Conventional 3 vessel aortic branching pattern. The visualized proximal subclavian arteries are widely patent. RIGHT CAROTID SYSTEM: Normal without aneurysm, dissection or stenosis. LEFT CAROTID SYSTEM: Normal without aneurysm, dissection or stenosis. VERTEBRAL ARTERIES: Left dominant configuration. Both origins are clearly patent. There is no dissection, occlusion or flow-limiting stenosis to the skull base (V1-V3 segments). CTA HEAD FINDINGS POSTERIOR CIRCULATION: --Vertebral arteries: Normal V4 segments. --Inferior cerebellar arteries: Normal. --Basilar artery: Normal. --Superior cerebellar arteries: Normal. --Posterior cerebral arteries (PCA): Normal. ANTERIOR CIRCULATION: --Intracranial internal carotid arteries: Normal. --Anterior cerebral arteries (ACA): Normal. Both A1 segments are present. Patent anterior communicating artery (a-comm). --Middle cerebral arteries (MCA): Normal. VENOUS SINUSES: As permitted by contrast timing, patent. ANATOMIC VARIANTS: None Review of the MIP images confirms the above findings. IMPRESSION: 1. No intracranial hemorrhage. ASPECTS is 10. 2. No emergent large vessel occlusion or high-grade stenosis of the head or neck. These results were communicated to Dr. Ritta Slot at  10:42 pm on 07/08/2019 by text page via the [facility] messaging system. Electronically Signed   By: Deatra Robinson M.D.   On: 07/08/2019 22:41   CT HEAD CODE STROKE WO CONTRAST  Result Date: 07/08/2019 CLINICAL DATA:  Slurred speech and headache. Left-sided vision disturbance. Code stroke. EXAM: CT HEAD WITHOUT CONTRAST CT ANGIOGRAPHY OF THE HEAD AND NECK TECHNIQUE: Contiguous axial images were obtained from the base of the skull through the vertex without intravenous contrast. Multidetector CT imaging of the head and neck was performed using the standard protocol during bolus administration of intravenous contrast. Multiplanar CT image reconstructions and MIPs were obtained to evaluate the vascular anatomy. Carotid stenosis measurements (when applicable) are obtained utilizing NASCET criteria, using the distal internal carotid diameter as the denominator. COMPARISON:  None. FINDINGS: CT HEAD FINDINGS Brain: There is no mass, hemorrhage or extra-axial collection. The size and configuration of the ventricles and extra-axial CSF spaces are normal. The brain parenchyma is normal, without evidence of acute or chronic infarction. Vascular: No abnormal hyperdensity of the major intracranial arteries or dural venous sinuses. No intracranial atherosclerosis. Skull: The visualized skull base, calvarium and extracranial soft tissues are normal. Sinuses/Orbits: No fluid levels or advanced mucosal thickening of the visualized paranasal sinuses. No mastoid or middle ear effusion. The orbits are normal. ASPECTS (Alberta Stroke Program Early CT Score) - Ganglionic level infarction (caudate, lentiform nuclei, internal capsule, insula, M1-M3 cortex): 7 - Supraganglionic infarction (M4-M6 cortex): 3 Total score (0-10 with 10 being normal): 10 CTA NECK FINDINGS SKELETON: There is no bony spinal canal stenosis. No lytic or blastic lesion. OTHER NECK: Normal pharynx,  larynx and major salivary glands. No cervical lymphadenopathy.  Unremarkable thyroid gland. UPPER CHEST: No pneumothorax or pleural effusion. No nodules or masses. AORTIC ARCH: There is no calcific atherosclerosis of the aortic arch. There is no aneurysm, dissection or hemodynamically significant stenosis of the visualized portion of the aorta. Conventional 3 vessel aortic branching pattern. The visualized proximal subclavian arteries are widely patent. RIGHT CAROTID SYSTEM: Normal without aneurysm, dissection or stenosis. LEFT CAROTID SYSTEM: Normal without aneurysm, dissection or stenosis. VERTEBRAL ARTERIES: Left dominant configuration. Both origins are clearly patent. There is no dissection, occlusion or flow-limiting stenosis to the skull base (V1-V3 segments). CTA HEAD FINDINGS POSTERIOR CIRCULATION: --Vertebral arteries: Normal V4 segments. --Inferior cerebellar arteries: Normal. --Basilar artery: Normal. --Superior cerebellar arteries: Normal. --Posterior cerebral arteries (PCA): Normal. ANTERIOR CIRCULATION: --Intracranial internal carotid arteries: Normal. --Anterior cerebral arteries (ACA): Normal. Both A1 segments are present. Patent anterior communicating artery (a-comm). --Middle cerebral arteries (MCA): Normal. VENOUS SINUSES: As permitted by contrast timing, patent. ANATOMIC VARIANTS: None Review of the MIP images confirms the above findings. IMPRESSION: 1. No intracranial hemorrhage. ASPECTS is 10. 2. No emergent large vessel occlusion or high-grade stenosis of the head or neck. These results were communicated to Dr. Ritta Slot at 10:42 pm on 07/08/2019 by text page via the [facility] messaging system. Electronically Signed   By: Deatra Robinson M.D.   On: 07/08/2019 22:41    PHYSICAL EXAM Obese middle-aged African-American male not in distress.  Appears anxious and restless. . Afebrile. Head is nontraumatic. Neck is supple without bruit.    Cardiac exam no murmur or gallop. Lungs are clear to auscultation. Distal pulses are well felt. Neurological Exam ;   Awake  Alert oriented x 3. Normal speech and language.eye movements full without nystagmus.fundi were not visualized. Vision acuity and fields appear normal. Hearing is normal. Palatal movements are normal. Face symmetric. Tongue midline. Normal strength, tone, reflexes and coordination. Normal sensation. Gait deferred.  ASSESSMENT/PLAN [name] is a 44 y.o. male with history of HTN, DB presenting with L sided weakness. Received tPA 07/08/2019 at 2246.  Possible Stroke vs TIA s/p tPA, pt left AMA prior to completing stroke workoup  Code Stroke CT head No acute abnormality. ASPECTS 10.     CTA head & neck no LVO  CT head 6/22 at 0022 negative  Ct 24h not completed   2D Echo not completed  LDL 103  HgbA1c pending   SCDs for VTE prophylaxis  aspirin 81 mg daily prior to admission, now on No antithrombotic as within 24h of tPA administration. Resume aspirin if 24h imaging neg for hemorrhage. Likely treat w/ DAPT depending on stroke etiology  Therapy recommendations:  No therapy needs  Disposition:  Patient demanded to leave. Dr. Pearlean Brownie advised risk of leaving as within 24h tPA administration. Security escorted out AMA.  Hypertension  Home meds:  Lisinopril 10  Stable  BP goal per post tPA protocol x 24h following tPA administration . Long-term BP goal normotensive  Hyperlipidemia  Home meds:  lipitor 20  LDL 103, goal < 70  Plan to continue statin at discharge  Diabetes type II   Home meds:  Glipizide, metformin  HgbA1c pending., goal < 7.0  Other Stroke Risk Factors  Cigarette smoker, advised to stop smoking  Obesity, There is no height or weight on file to calculate BMI., recommend weight loss, diet and exercise as appropriate   Possible Obstructive sleep apnea. Patient interested in participating in the Sleep Smart trial. [facility] will follow up for possible enrollment. Please contact them at [phone] for any  questions.  on CPAP at home  Other Active Problems  Hypokalemia 3.3  Hospital day # 1  He presented with strokelike episode and received IV TPA and remains at risk for neurological worsening and post TPA hemorrhage and needs close neurological monitoring and strict blood pressure control as per post TPA protocol.  Patient was reluctant to stay in the hospital and pursue required monitoring as well as ongoing stroke work-up to determine appropriate stroke risk ratification.  Despite me speaking to him as well as to his wife he left AGAINST MEDICAL ADVICE. This patient is critically ill and at significant risk of neurological worsening, death and care requires constant monitoring of vital signs, hemodynamics,respiratory and cardiac monitoring, extensive review of multiple databases, frequent neurological assessment, discussion with family, other specialists and medical decision making of high complexity.I have made any additions or clarifications directly to the above note.This critical care time does not reflect procedure time, or teaching time or supervisory time of PA/NP/Med Resident etc but could involve care discussion time.  I spent 30 minutes of neurocritical care time  in the care of  this patient.    Delia Heady, MD  To contact Stroke Continuity provider, please refer to [email or web address]. After hours, contact General Neurology

## 2019-07-10 LAB — HEMOGLOBIN A1C
Hgb A1c MFr Bld: 8 % — ABNORMAL HIGH (ref 4.8–5.6)
Mean Plasma Glucose: 183 mg/dL

## 2019-07-11 ENCOUNTER — Emergency Department (HOSPITAL_COMMUNITY)
Admission: EM | Admit: 2019-07-11 | Discharge: 2019-07-12 | Disposition: A | Discharge status: Home / Self Care | Payer: BC Managed Care – PPO | Attending: Emergency Medicine | Admitting: Emergency Medicine

## 2019-07-11 ENCOUNTER — Encounter (HOSPITAL_COMMUNITY): Payer: Self-pay

## 2019-07-11 DIAGNOSIS — R45851 Suicidal ideations: Secondary | ICD-10-CM | POA: Diagnosis not present

## 2019-07-11 DIAGNOSIS — Z7982 Long term (current) use of aspirin: Secondary | ICD-10-CM | POA: Insufficient documentation

## 2019-07-11 DIAGNOSIS — R4586 Emotional lability: Secondary | ICD-10-CM | POA: Diagnosis not present

## 2019-07-11 DIAGNOSIS — Z8673 Personal history of transient ischemic attack (TIA), and cerebral infarction without residual deficits: Secondary | ICD-10-CM | POA: Insufficient documentation

## 2019-07-11 DIAGNOSIS — I10 Essential (primary) hypertension: Secondary | ICD-10-CM | POA: Insufficient documentation

## 2019-07-11 DIAGNOSIS — F39 Unspecified mood [affective] disorder: Secondary | ICD-10-CM | POA: Insufficient documentation

## 2019-07-11 DIAGNOSIS — Z79899 Other long term (current) drug therapy: Secondary | ICD-10-CM | POA: Diagnosis not present

## 2019-07-11 DIAGNOSIS — Z7984 Long term (current) use of oral hypoglycemic drugs: Secondary | ICD-10-CM | POA: Diagnosis not present

## 2019-07-11 DIAGNOSIS — Z046 Encounter for general psychiatric examination, requested by authority: Secondary | ICD-10-CM | POA: Diagnosis not present

## 2019-07-11 LAB — CBC WITH DIFFERENTIAL/PLATELET
Abs Immature Granulocytes: 0.12 10*3/uL — ABNORMAL HIGH (ref 0.00–0.07)
Basophils Absolute: 0.1 10*3/uL (ref 0.0–0.1)
Basophils Relative: 0 %
Eosinophils Absolute: 0.1 10*3/uL (ref 0.0–0.5)
Eosinophils Relative: 1 %
HCT: 49.5 % (ref 39.0–52.0)
Hemoglobin: 15.8 g/dL (ref 13.0–17.0)
Immature Granulocytes: 1 %
Lymphocytes Relative: 25 %
Lymphs Abs: 2.9 10*3/uL (ref 0.7–4.0)
MCH: 25.8 pg — ABNORMAL LOW (ref 26.0–34.0)
MCHC: 31.9 g/dL (ref 30.0–36.0)
MCV: 80.9 fL (ref 80.0–100.0)
Monocytes Absolute: 0.7 10*3/uL (ref 0.1–1.0)
Monocytes Relative: 6 %
Neutro Abs: 7.7 10*3/uL (ref 1.7–7.7)
Neutrophils Relative %: 67 %
Platelets: 367 10*3/uL (ref 150–400)
RBC: 6.12 MIL/uL — ABNORMAL HIGH (ref 4.22–5.81)
RDW: 15.2 % (ref 11.5–15.5)
WBC: 11.6 10*3/uL — ABNORMAL HIGH (ref 4.0–10.5)
nRBC: 0 % (ref 0.0–0.2)

## 2019-07-11 LAB — COMPREHENSIVE METABOLIC PANEL
ALT: 29 U/L (ref 0–44)
AST: 19 U/L (ref 15–41)
Albumin: 4 g/dL (ref 3.5–5.0)
Alkaline Phosphatase: 62 U/L (ref 38–126)
Anion gap: 12 (ref 5–15)
BUN: 15 mg/dL (ref 6–20)
CO2: 24 mmol/L (ref 22–32)
Calcium: 9.4 mg/dL (ref 8.9–10.3)
Chloride: 104 mmol/L (ref 98–111)
Creatinine, Ser: 1.1 mg/dL (ref 0.61–1.24)
GFR calc Af Amer: >60 mL/min (ref >60)
GFR calc non Af Amer: >60 mL/min (ref >60)
Glucose, Bld: 184 mg/dL — ABNORMAL HIGH (ref 70–99)
Potassium: 3.9 mmol/L (ref 3.5–5.1)
Sodium: 140 mmol/L (ref 135–145)
Total Bilirubin: 0.5 mg/dL (ref 0.3–1.2)
Total Protein: 7.3 g/dL (ref 6.5–8.1)

## 2019-07-11 LAB — CBG MONITORING, ED: Glucose-Capillary: 150 mg/dL — ABNORMAL HIGH (ref 70–99)

## 2019-07-11 LAB — ETHANOL: Alcohol, Ethyl (B): <10 mg/dL (ref <10)

## 2019-07-11 MED ORDER — ASPIRIN EC 81 MG PO TBEC
81.0000 mg | DELAYED_RELEASE_TABLET | Freq: Every day | ORAL | Status: DC
  Administered 2019-07-11 – 2019-07-12 (×2): 81 mg via ORAL
  Filled 2019-07-11 (×2): qty 1

## 2019-07-11 MED ORDER — METFORMIN HCL 500 MG PO TABS
500.0000 mg | ORAL_TABLET | Freq: Two times a day (BID) | ORAL | Status: DC
  Administered 2019-07-11 – 2019-07-12 (×2): 500 mg via ORAL
  Filled 2019-07-11 (×2): qty 1

## 2019-07-11 MED ORDER — LISINOPRIL 10 MG PO TABS
10.0000 mg | ORAL_TABLET | Freq: Every day | ORAL | Status: DC
  Administered 2019-07-11 – 2019-07-12 (×2): 10 mg via ORAL
  Filled 2019-07-11 (×2): qty 1

## 2019-07-11 MED ORDER — LINAGLIPTIN 5 MG PO TABS
5.0000 mg | ORAL_TABLET | Freq: Every day | ORAL | Status: DC
  Administered 2019-07-11 – 2019-07-12 (×2): 5 mg via ORAL
  Filled 2019-07-11 (×3): qty 1

## 2019-07-11 MED ORDER — GLIPIZIDE 10 MG PO TABS
10.0000 mg | ORAL_TABLET | Freq: Every day | ORAL | Status: DC
  Administered 2019-07-11 – 2019-07-12 (×2): 10 mg via ORAL
  Filled 2019-07-11 (×2): qty 1

## 2019-07-11 MED ORDER — ATORVASTATIN CALCIUM 10 MG PO TABS
20.0000 mg | ORAL_TABLET | Freq: Every day | ORAL | Status: DC
  Administered 2019-07-11 – 2019-07-12 (×2): 20 mg via ORAL
  Filled 2019-07-11 (×2): qty 2

## 2019-07-11 NOTE — ED Provider Notes (Signed)
[facility] EMERGENCY DEPARTMENT Provider Note   CSN: [id number] Arrival date & time: 07/11/19  1902     History Chief Complaint  Patient presents with  . Suicidal    Samuel Maldonado is a 44 y.o. male.  44 year old male who presents under IVC due to suicidal ideations.  Patient himself denies this.  He denies responding to internal stimuli but according to the paperwork he has been.  Denies any recent alcohol or drug use.  Did sign himself out AMA from the hospital apparently.  IVC paperwork filled out by relative and patient presents here in custody of [employer].  He denies any complaints at this time        Past Medical History:  Diagnosis Date  . Hypertension     Patient Active Problem List   Diagnosis Date Noted  . Stroke (cerebrum) ([place]) 07/08/2019    History reviewed. No pertinent surgical history.     No family history on file.  Social History   Tobacco Use  . Smoking status: Not on file  Substance Use Topics  . Alcohol use: Not on file  . Drug use: Not on file    Home Medications Prior to Admission medications   Medication Sig Start Date End Date Taking? Authorizing Provider  acetaminophen (TYLENOL) 325 MG tablet Take 650 mg by mouth every 6 (six) hours as needed for mild pain.    [provider]  aspirin 81 MG EC tablet Take 81 mg by mouth daily. 02/21/19   [provider]  atorvastatin (LIPITOR) 20 MG tablet Take 20 mg by mouth daily. 05/01/19   [provider]  glipiZIDE (GLUCOTROL) 10 MG tablet Take 10 mg by mouth daily. 05/01/19   [provider]  lisinopril (ZESTRIL) 10 MG tablet Take 10 mg by mouth daily. 05/01/19   [provider]  metFORMIN (GLUCOPHAGE) 500 MG tablet Take 500 mg by mouth 2 (two) times daily. 05/01/19   [provider]  pseudoephedrine-acetaminophen (TYLENOL SINUS) 30-500 MG TABS tablet Take 1 tablet by mouth every 4 (four) hours as needed for congestion.     [provider]  sitaGLIPtin (JANUVIA) 100 MG tablet Take 100 mg by mouth daily at 12 noon. 08/08/18 08/08/19  [provider]  tadalafil (CIALIS) 20 MG tablet Take 20 mg by mouth daily as needed for erectile dysfunction.    [provider]    Allergies    Patient has no known allergies.  Review of Systems   Review of Systems  All other systems reviewed and are negative.   Physical Exam Updated Vital Signs There were no vitals taken for this visit.  Physical Exam Vitals and nursing note reviewed.  Constitutional:      General: He is not in acute distress.    Appearance: Normal appearance. He is well-developed. He is not toxic-appearing.  HENT:     Head: Normocephalic and atraumatic.  Eyes:     General: Lids are normal.     Conjunctiva/sclera: Conjunctivae normal.     Pupils: Pupils are equal, round, and reactive to light.  Neck:     Thyroid: No thyroid mass.     Trachea: No tracheal deviation.  Cardiovascular:     Rate and Rhythm: Normal rate and regular rhythm.     Heart sounds: Normal heart sounds. No murmur heard.  No gallop.   Pulmonary:     Effort: Pulmonary effort is normal. No respiratory distress.     Breath sounds: Normal  breath sounds. No stridor. No decreased breath sounds, wheezing, rhonchi or rales.  Abdominal:     General: Bowel sounds are normal. There is no distension.     Palpations: Abdomen is soft.     Tenderness: There is no abdominal tenderness. There is no rebound.  Musculoskeletal:        General: No tenderness. Normal range of motion.     Cervical back: Normal range of motion and neck supple.  Skin:    General: Skin is warm and dry.     Findings: No abrasion or rash.  Neurological:     Mental Status: He is alert and oriented to person, place, and time.     GCS: GCS eye subscore is 4. GCS verbal subscore is 5. GCS motor subscore is 6.     Cranial Nerves: No cranial nerve deficit.     Sensory: No sensory deficit.    Psychiatric:        Attention and Perception: Attention normal.        Speech: Speech normal.        Behavior: Behavior normal.        Thought Content: Thought content does not include suicidal ideation. Thought content does not include suicidal plan.     ED Results / Procedures / Treatments   Labs (all labs ordered are listed, but only abnormal results are displayed) Labs Reviewed  ETHANOL  RAPID URINE DRUG SCREEN, HOSP PERFORMED  CBC WITH DIFFERENTIAL/PLATELET  COMPREHENSIVE METABOLIC PANEL    EKG None  Radiology No results found.  Procedures Procedures (including critical care time)  Medications Ordered in ED Medications  lisinopril (ZESTRIL) tablet 10 mg (has no administration in time range)  glipiZIDE (GLUCOTROL) tablet 10 mg (has no administration in time range)  atorvastatin (LIPITOR) tablet 20 mg (has no administration in time range)  aspirin EC tablet 81 mg (has no administration in time range)  metFORMIN (GLUCOPHAGE) tablet 500 mg (has no administration in time range)  linagliptin (TRADJENTA) tablet 5 mg (has no administration in time range)    ED Course  I have reviewed the triage vital signs and the nursing notes.  Pertinent labs & imaging results that were available during my care of the patient were reviewed by me and considered in my medical decision making (see chart for details).    MDM Rules/Calculators/A&P                          Patient to be medically clear for psychiatric disposition Final Clinical Impression(s) / ED Diagnoses Final diagnoses:  None    Rx / DC Orders ED Discharge Orders    None       Lorre Nick, MD 07/11/19 2030

## 2019-07-11 NOTE — ED Notes (Addendum)
Called for security to wand pt. Pt was in purple 50, not seen by md or medically started/cleared. Spoke with Consulting civil engineer. Pt moved to hall 12. Multiple belonging bags placed in locker #4. Not inventoried.

## 2019-07-11 NOTE — ED Triage Notes (Signed)
Pt bib [employer] as IVC for SI. Pt has hx of bipolar, HTN, and diabetes.

## 2019-07-12 DIAGNOSIS — R4586 Emotional lability: Secondary | ICD-10-CM | POA: Diagnosis not present

## 2019-07-12 LAB — RAPID URINE DRUG SCREEN, HOSP PERFORMED
Amphetamines: NOT DETECTED
Barbiturates: NOT DETECTED
Benzodiazepines: NOT DETECTED
Cocaine: NOT DETECTED
Opiates: NOT DETECTED
Tetrahydrocannabinol: NOT DETECTED

## 2019-07-12 MED ORDER — ALUM & MAG HYDROXIDE-SIMETH 200-200-20 MG/5ML PO SUSP
30.0000 mL | Freq: Once | ORAL | Status: AC
  Administered 2019-07-12: 30 mL via ORAL
  Filled 2019-07-12: qty 30

## 2019-07-12 NOTE — ED Notes (Signed)
Patient is resting comfortably. 

## 2019-07-12 NOTE — ED Notes (Signed)
RN attempted to call [facility] to find out dispo status
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
## 2019-07-12 NOTE — ED Notes (Signed)
TTS in progress 
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
## 2019-07-12 NOTE — Discharge Instructions (Signed)
Please follow-up with an outpatient psychiatrist.  I have listed some resources for you.  If you have any new or concerning symptoms please return to ED.  I also called the suicide hotline. [phone]

## 2019-07-12 NOTE — ED Notes (Signed)
Pt walked over to purple zone from hallway bed. Pt dressed in purple scrubs. Cooperative at this time. Sitter with pt. Pt c/o heartburn. Will ask md for meds for heartburn.

## 2019-07-12 NOTE — ED Notes (Signed)
Patient denies pain and is resting comfortably.  

## 2019-07-12 NOTE — ED Notes (Signed)
RN called [facility] to try to speak with Vernona Rieger about ETA for reassessment

## 2019-07-12 NOTE — [facility] Counselor (Signed)
Clinician attempted to contact [name], [phone]) however the call was sent to voicemail. Clinician left a HIPPA complaint voice message.    Redmond Pulling, MS, [facility], [facility] Triage Specialist [phone]
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
## 2019-07-12 NOTE — BH Assessment (Signed)
Victorino Dike, RN to have IVC paperwork faxed. Once IVC paperwork is received and review TTS will engage pt in assessment. '  Redmond Pulling, MS, [facility], [facility] Triage Specialist [phone]    Redmond Pulling, MS, [facility], [facility] Triage Specialist [phone]

## 2019-07-12 NOTE — ED Notes (Signed)
Lunch Tray Ordered @ 1048. °

## 2019-07-12 NOTE — ED Provider Notes (Addendum)
Emergency Medicine Observation Re-evaluation Note  Samuel Maldonado is a 44 y.o. male, seen on rounds today.  Pt initially presented to the ED for complaints of Suicidal Currently, the patient is lying in bed calmly answers questions and is very pleasant.  Physical Exam  BP 117/73 (BP Location: Right Arm)   Pulse 81   Temp 98 F (36.7 C) (Oral)   Resp 18   SpO2 94%  Physical Exam Vitals and nursing note reviewed.  Constitutional:      General: He is not in acute distress.    Appearance: Normal appearance. He is not ill-appearing.  HENT:     Head: Normocephalic and atraumatic.  Eyes:     General: No scleral icterus.       Right eye: No discharge.        Left eye: No discharge.     Conjunctiva/sclera: Conjunctivae normal.  Pulmonary:     Effort: Pulmonary effort is normal.     Breath sounds: No stridor.  Neurological:     Mental Status: He is alert and oriented to person, place, and time. Mental status is at baseline.  Psychiatric:     Comments: Pleasant, able answer questions appropriately, follows commands, no pressured speech, normal mood.  Currently denies any SI, HI, AVH     ED Course / MDM  EKG:    I have reviewed the labs performed to date as well as medications administered while in observation.    Plan  Current plan is for reassessment by psych this morning.  Disposition is pending per psychiatry recommendations.   Patient is not under full IVC at this time.   Gailen Shelter, [place] 07/12/19 1216    Melene Plan, DO 07/12/19 1237   Addendum: 3:18 PM  Pt to be discharged with resources. Per psych NP is stable for DC with psych follow up. Patient agreeable to plan. Will Dc at this time.     Gailen Shelter, [place] 07/12/19 1519    Melene Plan, DO 07/15/19 1628

## 2019-07-16 ENCOUNTER — Encounter: Payer: Self-pay | Admitting: Family Medicine

## 2019-07-16 ENCOUNTER — Other Ambulatory Visit: Payer: Self-pay

## 2019-07-16 ENCOUNTER — Ambulatory Visit (INDEPENDENT_AMBULATORY_CARE_PROVIDER_SITE_OTHER): Payer: BC Managed Care – PPO | Admitting: Family Medicine

## 2019-07-16 VITALS — BP 126/78 | HR 105 | Ht 68.0 in | Wt 250.0 lb

## 2019-07-16 DIAGNOSIS — R4586 Emotional lability: Secondary | ICD-10-CM

## 2019-07-16 DIAGNOSIS — R29818 Other symptoms and signs involving the nervous system: Secondary | ICD-10-CM | POA: Diagnosis not present

## 2019-07-16 DIAGNOSIS — G4733 Obstructive sleep apnea (adult) (pediatric): Secondary | ICD-10-CM

## 2019-07-16 DIAGNOSIS — E118 Type 2 diabetes mellitus with unspecified complications: Secondary | ICD-10-CM

## 2019-07-16 DIAGNOSIS — I639 Cerebral infarction, unspecified: Secondary | ICD-10-CM | POA: Diagnosis not present

## 2019-07-16 LAB — POCT GLYCOSYLATED HEMOGLOBIN (HGB A1C): HbA1c, POC (controlled diabetic range): 7.8 % — AB (ref 0.0–7.0)

## 2019-07-16 LAB — GLUCOSE, POCT (MANUAL RESULT ENTRY): POC Glucose: 237 mg/dl — AB (ref 70–99)

## 2019-07-16 NOTE — Assessment & Plan Note (Signed)
Stop bang survey shows a very high likelihood of obstructive sleep apnea. -Ambulatory referral to sleep medicine

## 2019-07-16 NOTE — Patient Instructions (Addendum)
It was great to meet you today [name].  Thanks for coming in.  I think the big thing we need to focus on for this visit is continuing the work-up for your stroke.  I think we have the same goal and avoiding future strokes as much as possible.  For now, I have put an order for you to have an ultrasound of your heart and head imaging.  Hopefully, we can have these done in the next week.  If your head imaging is unremarkable, we can start appropriate therapy to help avoid future strokes.  I have also put in a referral for you to see physical therapy who can continue to work with you for strength and conditioning.  I have put in a referral for you to see neurology as well to ensure that there is no additional components of the stroke work-up that I have missed.  I would like you to come back to clinic to see me in the next week.  Please see the information below for additional resources regarding mental health and treatment for bipolar disorder.  Please establish care with a psychiatrist who can help treat bipolar disorder.    If you are feeling suicidal or depression symptoms worsen please immediately go to:   24 Hour Availability [facility]  [address], [place], [place] [address]  [phone] or [phone]  . If you are thinking about harming yourself or having thoughts of suicide, or if you know someone who is, seek help right away. . Call your doctor or mental health care provider. . Call 911 or go to a hospital emergency room to get immediate help, or ask a friend or family member to help you do these things. . Call the [place] National Suicide Prevention Lifeline's toll-free, 24-hour hotline at 1-800-273-TALK 770 012 0422) or TTY: 1-800-799-4 TTY 667-230-2236) to talk to a trained counselor. . If you are in crisis, make sure you are not left alone.  . If someone else is in crisis, make sure he or she is not left alone   [facility] of the [employer] (Domestic Violence, Rape & Victim Assistance ([phone]) [phone]  [employer] [facility]  [address], [place]  [address]               [phone] or [phone]  [facility]    (ONLY from 8am-4pm)    [phone]  [facility] (24/7)   864-323-5851  [place] National Suicide Hotline   726-631-4658 (920)116-9961)  Psychiatry Resource List (Adults and Children) Most of these providers will take Medicaid. please consult your insurance for a complete and updated list of available providers. When calling to make an appointment have your insurance information available to confirm you are covered.  [facility] Behavioral Health Clinics:   [place]: [address]     [phone]   [place]: [address] [place],        [phone] [place]: [address],    [phone] [place] Sharper: [phone] [place],                   [phone] Children: [facility] [address]         [phone]   [facility]  (Psychiatry only; Adults /children 12 and over, will take Medicaid)  [address], [place], [place] [address]       [phone]   [facility] (Psychiatry & counseling ; adults & children ; will take Medicaid [address]  [place] [address]   Go on-line to complete referral ( https://www.[email or web address].org/en/make-[email or web address]-referral (720) [phone]    (Spanish therapist)  [facility]  Psychiatry & counseling; Adults and children;  Call Registration prior to scheduling an appointment [phone] [address]    [place], [place] [address]    [phone]  [facility] (Psychiatry & counseling; adults & children; Medicare no Medicaid)  [address]   [place], [place]  [address]      [phone]    [facility] (up to age 34)   Psychiatry & counseling ,will take Medicaid, must do counseling to receive psychiatry services  [address] [place] [address]        [phone]  [facility] (Psychiatry & counseling; adults & children; will take Medicaid) Will need a referral from provider [address],  [place], [place]  [phone]  [facility]---  Walk-in Mon-Fri, 8:30-5:00 (will take Medicaid)  [address], [place], [place]  [phone]  To schedule an appointment call [phone]  [facility] --- Walk-In Mon-Friday 8am-3pm ( will take Medicaid, Psychiatry, Adults & children,  [address] [place], [place]   [phone]   [facility] [place], Walk-in M-F 8am-12pm and 1pm -3pm   (Counseling, Psychiatry, will take Medicaid, adults & children)  [address] [place], [place]  [phone]
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
## 2019-07-16 NOTE — Assessment & Plan Note (Addendum)
Samuel Maldonado seems to have left the hospital part way through the work-up for his stroke evaluation.  I will attempt to pick up and complete the evaluation as best I can here in the outpatient setting.  Per my reading of the final neurology note here the next steps: -CT head to ensure no new hemorrhage following TPA administration -If CT head is unremarkable, initiate aspirin, Plavix -Echocardiogram complete -Ambulatory referral to physical therapy -Ambulatory referral to neurology for further assistance in evaluation and appropriate treatment of the stroke in the outpatient setting -Follow-up in 1 week for further work-up and optimization of other medical therapies and risk factors

## 2019-07-17 ENCOUNTER — Encounter (HOSPITAL_COMMUNITY): Payer: Self-pay | Admitting: Family Medicine

## 2019-07-17 ENCOUNTER — Ambulatory Visit (HOSPITAL_COMMUNITY)
Admission: RE | Admit: 2019-07-17 | Discharge: 2019-07-17 | Disposition: A | Discharge status: Home / Self Care | Payer: BC Managed Care – PPO | Source: Ambulatory Visit | Attending: Family Medicine | Admitting: Family Medicine

## 2019-07-17 ENCOUNTER — Encounter (HOSPITAL_COMMUNITY): Payer: Self-pay | Admitting: Internal Medicine

## 2019-07-17 DIAGNOSIS — I639 Cerebral infarction, unspecified: Secondary | ICD-10-CM | POA: Diagnosis not present

## 2019-07-17 DIAGNOSIS — I10 Essential (primary) hypertension: Secondary | ICD-10-CM | POA: Diagnosis not present

## 2019-07-17 DIAGNOSIS — G4730 Sleep apnea, unspecified: Secondary | ICD-10-CM | POA: Diagnosis not present

## 2019-07-17 NOTE — Progress Notes (Signed)
Echocardiogram 2D Echocardiogram has been performed.  Samuel Maldonado 07/17/2019, 1:59 PM

## 2019-07-18 ENCOUNTER — Telehealth: Payer: Self-pay | Admitting: Family Medicine

## 2019-07-18 NOTE — Telephone Encounter (Signed)
Clinical info completed on FML2 form.  Place form in PCPs box for completion.  Aquilla Solian, CMA

## 2019-07-18 NOTE — Telephone Encounter (Signed)
Patient wife is dropping off short term disability paperwork to be filled out by Dr. Homero Fellers. Last day of service was 07/16/19.  Patient would like for someone to call him when forms are ready to be picked up. [phone]  I have placed form in blue team folder.

## 2019-07-23 ENCOUNTER — Encounter: Payer: Self-pay | Admitting: Family Medicine

## 2019-07-23 ENCOUNTER — Other Ambulatory Visit: Payer: Self-pay

## 2019-07-23 ENCOUNTER — Ambulatory Visit (INDEPENDENT_AMBULATORY_CARE_PROVIDER_SITE_OTHER): Payer: BC Managed Care – PPO | Admitting: Family Medicine

## 2019-07-23 ENCOUNTER — Ambulatory Visit
Admission: RE | Admit: 2019-07-23 | Discharge: 2019-07-23 | Disposition: A | Discharge status: Home / Self Care | Payer: BC Managed Care – PPO | Source: Ambulatory Visit | Attending: Family Medicine | Admitting: Family Medicine

## 2019-07-23 DIAGNOSIS — J3489 Other specified disorders of nose and nasal sinuses: Secondary | ICD-10-CM | POA: Diagnosis not present

## 2019-07-23 DIAGNOSIS — R4586 Emotional lability: Secondary | ICD-10-CM | POA: Diagnosis not present

## 2019-07-23 DIAGNOSIS — R29818 Other symptoms and signs involving the nervous system: Secondary | ICD-10-CM

## 2019-07-23 DIAGNOSIS — I639 Cerebral infarction, unspecified: Secondary | ICD-10-CM | POA: Diagnosis not present

## 2019-07-23 MED ORDER — CLOPIDOGREL BISULFATE 75 MG PO TABS: 75.0000 mg | ORAL_TABLET | Freq: Every day | ORAL | 3 refills | Status: AC

## 2019-07-23 MED ORDER — GABAPENTIN 100 MG PO CAPS: 100.0000 mg | ORAL_CAPSULE | Freq: Three times a day (TID) | ORAL | 3 refills | Status: AC

## 2019-07-23 MED ORDER — ASPIRIN 81 MG PO TBEC: 81.0000 mg | DELAYED_RELEASE_TABLET | Freq: Every day | ORAL | 0 refills | Status: AC

## 2019-07-23 NOTE — Assessment & Plan Note (Addendum)
Moderate symptoms of anxiety today.  We discussed that his anxiety is a little bit more complex to treat because of his underlying diagnosis of bipolar disorder.  After discussing with Dr. McDiarmid, we will plan to start treatment with gabapentin 100 mg 3 times daily to help with anxiety while he awaits his appointment with psychiatry.  He was also told that we would ask our social worker to reach out with exercises he could perform at home to help ease anxiety. -Referral to CCM

## 2019-07-23 NOTE — Telephone Encounter (Signed)
Patient picked up paperwork while in clinic today.  Trenisha Lafavor,CMA

## 2019-07-23 NOTE — Progress Notes (Signed)
    SUBJECTIVE:   CHIEF COMPLAINT / HPI:   CVA follow-up [name] was last seen in clinic on 6/29 for follow-up regarding his acute CVA.  At that time, referrals were placed for physical therapy, neurology, sleep medicine.  He has had an echocardiogram performed in the interim which showed no evidence of thrombus or dilation of the left or right atria or ventricles.  He has not noted any new focal deficits since our last visit they continues to note occasional headaches.  His wife notes that he continues to have some difficulty walking and his left leg occasionally gives out.  Anxiety Is a previous medical history of bipolar disorder and was recently involuntarily committed for his suicidal ideation and agitation.  Today, he continues to note significant symptoms of anxiety.  He also notes some depressive symptoms but the anxious symptoms appear to predominate.  He has a PHQ-9 score of 11 today.  He denies SI/HI.  He is awaiting to follow-up with psychiatry in the outpatient setting but does not have an appointment for at least another month.  He wants to know if there is anything he can start taking in the meantime to help his symptoms.  PERTINENT  PMH / PSH: Recent CVA, bipolar disorder, diabetes  OBJECTIVE:   BP 134/76   Pulse (!) 105   Ht 5\' 8"  (1.727 m)   Wt 252 lb (114.3 kg)   SpO2 96%   BMI 38.32 kg/m    General: Middle-aged man.  Resting comfortably in the exam chair.  Able to step up onto the exam table without difficulty. Neuro Cranial nerves: Cranial nerves II-XII intact Upper extremities: Normal sensation to light touch.  5/5 strength on the right side for elbow flexion, extension and grip strength.  4/5 strength noted on the left side for all movements. Lower extremities: Normal sensation to light touch.  5/5 strength on the right side for hip flexion and knee flexion.  4/5 strength on the left side for hip flexion and knee flexion.  ASSESSMENT/PLAN:   Stroke  (cerebrum) (HCC) Normal echo without significant structural abnormality or evidence of thrombus.  CT head was scheduled for 7/13.  [facility] was called to see if this order could be expedited.  Ultimately, [facility] imaging was amenable to having the patient come in today for his CT head.  Samuel Maldonado was encouraged to set up the voicemail in his phone so that secure messages regarding his health and his referrals to physical therapy, neurology and sleep medicine can be left on his voicemail. -Follow-up head CT.  If no evidence of hemorrhage, start aspirin and Plavix.  Discontinue aspirin after 21 days. -Follow-up with neurology -Follow-up with physical therapy -Follow-up with sleep medicine -Follow-up in 2 weeks to ensure he is being connected with the specialists  Mood and affect disturbance Moderate symptoms of anxiety today.  We discussed that his anxiety is a little bit more complex to treat because of his underlying diagnosis of bipolar disorder.  After discussing with Dr. McDiarmid, we will plan to start treatment with gabapentin 100 mg 3 times daily to help with anxiety while he awaits his appointment with psychiatry.  He was also told that we would ask our social worker to reach out with exercises he could perform at home to help ease anxiety. -Referral to transitions care placed      Bufford Buttner, MD [facility] Center

## 2019-07-23 NOTE — Telephone Encounter (Signed)
Form filled out and signed and placed in folder in front office. Mirian Mo, MD

## 2019-07-23 NOTE — Patient Instructions (Signed)
Recent stroke: Please remember to set up your voicemail so that you can get messages related to the referrals we have been for you for neurology, physical therapy and sleep medicine.  Following the visit today, please go directly to [facility] imaging so that he can have the CT head done.  Once I get the results from the imaging, if they are negative, we can switch to an aspirin and Plavix daily.  Please come back to clinic in 2 weeks to make sure that you are on track with all of your therapies.  Anxiety: Your diagnosis of bipolar disorder limits the medications that we can use.  Psychiatry is the best specialty to help you with this issue.  We can start a medication today called gabapentin.  You can start taking this 3 times a day.  I do think this will be helpful for anxiety although it is one of the less common anxiety medications.  We can plan to stop this once you are able to see psychiatry have their input.

## 2019-07-23 NOTE — Assessment & Plan Note (Signed)
Normal echo without significant structural abnormality or evidence of thrombus.  CT head was scheduled for 7/13.  [facility] imaging was called to see if this order could be expedited.  Ultimately, [facility] was amenable to having the patient come in today for his CT head.  Samuel Maldonado was encouraged to set up the voicemail in his phone so that secure messages regarding his health and his referrals to physical therapy, neurology and sleep medicine can be left on his voicemail. -Follow-up head CT.  If no evidence of hemorrhage, start aspirin and Plavix.  Discontinue aspirin after 21 days. -Follow-up with neurology -Follow-up with physical therapy -Follow-up with sleep medicine -Follow-up in 2 weeks to ensure he is being connected with the specialists

## 2019-07-26 ENCOUNTER — Telehealth: Payer: Self-pay | Admitting: *Deleted

## 2019-07-26 NOTE — Telephone Encounter (Signed)
Spoke with Wife of pt.  His STD worker Veneta Penton [phone]) needed some additional information.  I have called and LM for callback to decipher what info is needed.  Pts claim number is [id number]. Jone Baseman, CMA

## 2019-07-26 NOTE — Telephone Encounter (Signed)
Perfect. Thank you!

## 2019-07-26 NOTE — Telephone Encounter (Signed)
Pt returning call to Dr. Homero Fellers.  Per Dr. Homero Fellers  He wanted to let him know that his CT was negative. He is to resstart aspirin and plavix, already been sent to the pharmacy.  Pt informed and appreciative. Jone Baseman, CMA

## 2019-07-29 NOTE — Telephone Encounter (Signed)
Spoke with Samuel Maldonado and questions were answered. Samuel Maldonado, CMA

## 2019-07-30 ENCOUNTER — Other Ambulatory Visit: Payer: BC Managed Care – PPO

## 2019-07-30 ENCOUNTER — Telehealth: Payer: Self-pay

## 2019-07-30 NOTE — Telephone Encounter (Signed)
-----   Message from Mirian Mo, MD sent at 07/25/2019  6:19 PM EDT ----- Regarding: Normal head CT I encouraged Mr. Crosson and his wife to ensure they had updated contact information in the chart so we could communicate with him quickly. I was not able to get in touch with either Mr. Randazzo or his wife based on the information we had in the chart.Please reach out to Mr. Witherspoon or his wife during the day to tell them that the head CT shows no evidence of bleeding and it is safe and important for him to start the aspirin and Plavix which we talked about in the clinic. The prescription is already sent to his pharmacy and he can start that medication today.Thank you, Mirian Mo, MD

## 2019-07-30 NOTE — Telephone Encounter (Signed)
Spoke with pt about CT scan results. And to start medication of  aspirin and plavix. Pt understood and also said he had received the voice message as well. Aquilla Solian, CMA

## 2019-07-31 ENCOUNTER — Telehealth: Payer: Self-pay | Admitting: Family Medicine

## 2019-07-31 NOTE — Chronic Care Management (AMB) (Signed)
  Care Management   Note  07/31/2019 Name: Samuel Maldonado MRN: [id number] DOB: Jul 05, 1975  Samuel Maldonado is a 44 y.o. year old male who is a primary care patient of Mirian Mo, MD. I reached out to [facility] by phone today in response to a referral sent by [name] health plan.    Mr. Deal was given information about care management services today including:  1. Care management services include personalized support from designated clinical staff supervised by his physician, including individualized plan of care and coordination with other care providers 2. 24/7 contact phone numbers for assistance for urgent and routine care needs. 3. The patient may stop care management services at any time by phone call to the office staff.  Patient agreed to services and verbal consent obtained.   Follow up plan: Telephone appointment with care management team member scheduled for:08/09/2019  [facility] Guide, Embedded Care Coordination [facility]  [place], [place] [address] Direct Dial: [phone] [email or web address] Website: [email or web address]

## 2019-08-04 NOTE — Progress Notes (Signed)
Kindly inform the patient that diabetes screening test is not satisfactory and to see primary care physician to help correct this

## 2019-08-05 ENCOUNTER — Telehealth: Payer: Self-pay

## 2019-08-05 NOTE — Telephone Encounter (Signed)
Pt advised of results and verbalized understanding. He will follow up with PCP.

## 2019-08-09 ENCOUNTER — Other Ambulatory Visit: Payer: Self-pay

## 2019-08-09 ENCOUNTER — Ambulatory Visit: Payer: Self-pay | Admitting: Licensed Clinical Social Worker

## 2019-08-09 ENCOUNTER — Ambulatory Visit: Payer: BC Managed Care – PPO | Admitting: Licensed Clinical Social Worker

## 2019-08-09 DIAGNOSIS — F439 Reaction to severe stress, unspecified: Secondary | ICD-10-CM

## 2019-08-09 DIAGNOSIS — Z139 Encounter for screening, unspecified: Secondary | ICD-10-CM

## 2019-08-09 NOTE — Chronic Care Management (AMB) (Signed)
Care Management   Clinical Social Work initial Note  08/09/2019 Name: Samuel Maldonado MRN: [id number] DOB: 1975/07/17 Samuel Maldonado is a 44 y.o. year old male who sees Mirian Mo, MD for primary care. The Care Management team was consulted by PCP to assist the patient with managing stress while waiting to connect with mental health provider.  LCSW reached out to [facility] today by phone to introduce self, assess needs and barriers to care.   Assessment: Patient is pleasant and engaged in conversation.  Experiencing difficulty with managing his moods. History or mental health treatment and counseling when he was younger but none as an adult and he does not remember diagnosis or treatment.  Patient's mood is impacting his daily function and has recent inpatient behavioral health treatment however they did not start patient on any medications.       Recommendation: Patient may benefit from, and is in agreement to implement the interventions discussed today and keep appointment on July 28th with [facility] [place] Plan: Patient will call LCSW if needed  Review of patient status, including review of consultants reports, relevant laboratory and other test results, and collaboration with appropriate care team members and the patient's provider was performed as part of comprehensive patient evaluation and provision of chronic care management services.    Advance Directive Status: Not addressed in this encounter SDOH (Social Determinants of Health) assessments performed: Yes:   SDOH Interventions     Most Recent Value  SDOH Interventions  SDOH Interventions for the Following Domains Depression, Stress  Stress Interventions Provide Counseling  Depression Interventions/Treatment  Counseling  [Emmi Education on relieving stress]      ; Goals Addressed            This Visit's Progress   . relieving stress       CARE PLAN ENTRY (see longitudinal plan of care for  additional care plan information)  Current Barriers:  . Patient with history of mood disorder acknowledges deficits with managing stress . Patient needs Support, Education, and Care Coordination in order to meet unmet mental health needs  . Patient is connected with mental health provider for counseling and medication management. Has appointment July 28th with [facility] the [place].  . Patient has recent inpatient treatment for SI      Clinical Social Work Goal(s):  [name] Over the next 30 days, patient will connect with [facility] for ongoing counseling and medication  . Patient will implement clinical interventions discussed today to decreases symptoms of anxiety and stress and increase knowledge and/or ability of: coping skills and self-management skills. Interventions:  . Assessed patient's  previous treatment, needs and barriers to care . Provided basic mental health support, education and interventions ( provided EMMI education on Relieving stress) . Discussed several options for long term counseling based on need and insurance. patient has appoint . Reviewed mental health medications with patient prescribed by PCP and discussed compliance. Patient reports gabapentin not helping much . Other interventions include: Motivational Interviewing ,Mindfulness or Relaxation Training,Psychoeducation and/or Health Education, Patient Self Care Activities & Deficits:  . Patient is unable to independently navigate community resource options without care coordination support . Patient is able to implement clinical interventions discussed today  . Patient will scheduled appointment with [facility] the [place] . Patient is motivated for treatment Initial goal documentation       Outpatient Encounter Medications as of 08/09/2019  Medication Sig  .  acetaminophen (TYLENOL) 325 MG tablet Take 650 mg by mouth every 6 (six) hours as needed for mild pain.  [name] aspirin 81 MG  EC tablet Take 1 tablet (81 mg total) by mouth daily.  [name] atorvastatin (LIPITOR) 20 MG tablet Take 20 mg by mouth daily.  . clopidogrel (PLAVIX) 75 MG tablet Take 1 tablet (75 mg total) by mouth daily.  [name] gabapentin (NEURONTIN) 100 MG capsule Take 1 capsule (100 mg total) by mouth 3 (three) times daily.  [name] glipiZIDE (GLUCOTROL) 10 MG tablet Take 10 mg by mouth daily.  [name] lisinopril (ZESTRIL) 10 MG tablet Take 10 mg by mouth daily.  . metFORMIN (GLUCOPHAGE) 500 MG tablet Take 500 mg by mouth 2 (two) times daily.  . sitaGLIPtin (JANUVIA) 100 MG tablet Take 100 mg by mouth daily at 12 noon.  . tadalafil (CIALIS) 20 MG tablet Take 20 mg by mouth daily as needed for erectile dysfunction.   No facility-administered encounter medications on file as of 08/09/2019.       Samuel Hines, LCSW Care Management & Coordination  [facility]   [phone] 10:05 AM

## 2019-08-09 NOTE — Chronic Care Management (AMB) (Signed)
  Social Work Care Management  Unsuccessful Phone Outreach   08/09/2019 Name: Rylyn Ranganathan MRN: [id number] DOB: [birth year]  Referred by:  PCP Reason for referral : Care Coordination ( for stress)   Undrea Shipes is a 43 y.o. year old male who sees Mirian Mo, MD for primary care.  LCSW received referral to assist patient with relieving stress associated behavioral health concerns .  LCSW called patient to assess needs and barriers reference the above referral. Telephone outreach was unsuccessful. A HIPPA compliant phone message was left for the patient providing contact information and requesting a return call.  Plan:  If no return call is received. LCSW will call again in 3 to 5 days.  Review of patient status, including review of consultants reports, relevant laboratory and other test results, and collaboration with appropriate care team members and the patient's provider was performed as part of comprehensive patient evaluation and provision of care management services.   [name], LCSW Care Management & Coordination  [facility] / [facility]   [phone] 9:11 AM

## 2019-08-13 ENCOUNTER — Other Ambulatory Visit: Payer: Self-pay

## 2019-08-13 ENCOUNTER — Encounter: Payer: Self-pay | Admitting: Family Medicine

## 2019-08-13 ENCOUNTER — Ambulatory Visit (INDEPENDENT_AMBULATORY_CARE_PROVIDER_SITE_OTHER): Payer: BC Managed Care – PPO | Admitting: Family Medicine

## 2019-08-13 VITALS — BP 130/80 | HR 106 | Ht 68.0 in | Wt 250.5 lb

## 2019-08-13 DIAGNOSIS — Z1211 Encounter for screening for malignant neoplasm of colon: Secondary | ICD-10-CM

## 2019-08-13 DIAGNOSIS — Z1159 Encounter for screening for other viral diseases: Secondary | ICD-10-CM

## 2019-08-13 DIAGNOSIS — N529 Male erectile dysfunction, unspecified: Secondary | ICD-10-CM | POA: Diagnosis not present

## 2019-08-13 DIAGNOSIS — R4586 Emotional lability: Secondary | ICD-10-CM

## 2019-08-13 DIAGNOSIS — I639 Cerebral infarction, unspecified: Secondary | ICD-10-CM

## 2019-08-13 DIAGNOSIS — Z125 Encounter for screening for malignant neoplasm of prostate: Secondary | ICD-10-CM

## 2019-08-13 DIAGNOSIS — N400 Enlarged prostate without lower urinary tract symptoms: Secondary | ICD-10-CM

## 2019-08-13 MED ORDER — TAMSULOSIN HCL 0.4 MG PO CAPS
0.4000 mg | ORAL_CAPSULE | Freq: Every day | ORAL | 3 refills | Status: DC
Start: 2019-08-13 — End: 2020-07-14

## 2019-08-13 NOTE — Assessment & Plan Note (Signed)
-  Continue Cialis 20 mg for now.  He was encouraged not to double up on this dose as this is the max dose. -Placed ambulatory referral to urology for erectile dysfunction.

## 2019-08-13 NOTE — Patient Instructions (Signed)
Stroke work-up: I am glad that we have made a lot of progress with your stroke work-up.  Please member to follow-up with neurology on 7/29.  I will send a message regarding physical therapy to make sure that has not fallen through the cracks.  Continue to take your aspirin and Plavix daily.  I would like neurology to weigh in regarding whether you should stay on aspirin or Plavix long-term.  Colon cancer screening: I placed a referral to GI, you should get a call in the next week or two about scheduling an appointment for a colonoscopy.  Prostate cancer screening: Based on your symptoms of BPH and family history, going to get a blood test today.  I will let you know if there are any concerning findings. Erectile dysfunction: I have placed a referral to urology for further discussion about treatment options.  Prostatic hypertrophy.  Based on your symptoms, it does sound like your prostate is getting larger.  I have refilled your prescription of Flomax.  It would also be helpful for you to see urology to see if they have any additional concerns.

## 2019-08-13 NOTE — Progress Notes (Signed)
     SUBJECTIVE:   CHIEF COMPLAINT / HPI:   Stroke work-up Since her last visit, he has a an appointment set up for a sleep study to see if he would benefit from intervention for obstructive sleep apnea.  He is also scheduled an appointment with neurology on 7/29.  He has not yet received a call from physical therapy to set up an appointment.  He continues to take aspirin and Plavix daily.  BPH He has a known history of BPH and has previously been treated with Flomax alone is not currently taking any medications.  Today he has an AUA BPH score of 21.  He reports that he does have a family history of prostate cancer in his uncles.  He believes that the youngest age at which uncle was diagnosed with prostate cancer was 66.  He is interested in screening for prostate cancer today.  Screening for colon cancer He has never been screened for colon cancer before and occasionally notices small streaks of blood on the toilet paper after wiping.  He is not a smoker.  Erectile dysfunction He is struggled with erectile dysfunction for years.  His current medication includes Cialis 20 mg.  He notes that he sometimes takes 2 or 3 pills at a time to achieve the desired effect.  He has over like to know if there are any additional treatment options at this time.  PERTINENT  PMH / PSH: Hypertension, CVA, OSA  OBJECTIVE:   BP (!) 130/80   Pulse (!) 106   Ht 5\' 8"  (1.727 m)   Wt (!) 250 lb 8 oz (113.6 kg)   SpO2 96%   BMI 38.09 kg/m    General: Alert and cooperative and appears to be in no acute distress Respiratory: Breathing comfortably on room air.  No respiratory distress. Skin: Warm, dry. Rectal exam: Significant redundant gluteal tissue making the rectal exam technically difficult.  No abnormalities appreciated in the internal/external anal sphincter.  Difficult to palpate prostate.   ASSESSMENT/PLAN:   Erectile dysfunction -Continue Cialis 20 mg for now.  He was encouraged not to double  up on this dose as this is the max dose. -Placed ambulatory referral to urology for erectile dysfunction.  Prostatic hyperplasia Significant symptoms within AUA survey score of 21.  He has previously been on Flomax but has not taken it since he transition doctors. -Restart Flomax 0.4 mg daily  Screening for prostate cancer Discussed risks and benefits.  His known history of BPH and a family history of prostate cancer in 2 uncles with early stage of onset being 65. -Follow-up PSA  Mood and affect disturbance Not taking gabapentin because it makes him drowsy and he feels that he does not need it.  He was encouraged to follow-up with psychiatry.  Stroke (cerebrum) (HCC) Work-up nearly complete. -Follow-up neurology notes for future visit on 7/29 -Obtained phone number for physical therapy.  Will call patient with lab work results in a day or 2 and provide the appropriate number for physical therapy which is 8/29    [phone], MD [facility]

## 2019-08-13 NOTE — Assessment & Plan Note (Signed)
Discussed risks and benefits.  His known history of BPH and a family history of prostate cancer in 2 uncles with early stage of onset being 36. -Follow-up PSA

## 2019-08-13 NOTE — Assessment & Plan Note (Addendum)
Work-up nearly complete. -Follow-up neurology notes for future visit on 7/29 -Obtained phone number for physical therapy.  Will call patient with lab work results in a day or 2 and provide the appropriate number for physical therapy which is [phone]

## 2019-08-13 NOTE — Assessment & Plan Note (Signed)
Not taking gabapentin because it makes him drowsy and he feels that he does not need it.  He was encouraged to follow-up with psychiatry.

## 2019-08-13 NOTE — Assessment & Plan Note (Signed)
Significant symptoms within AUA survey score of 21.  He has previously been on Flomax but has not taken it since he transition doctors. -Restart Flomax 0.4 mg daily

## 2019-08-14 ENCOUNTER — Encounter: Payer: Self-pay | Admitting: Family Medicine

## 2019-08-14 LAB — HEPATITIS C ANTIBODY: Hep C Virus Ab: <0.1 s/co ratio (ref 0.0–0.9)

## 2019-08-14 LAB — PSA: Prostate Specific Ag, Serum: 1.1 ng/mL (ref 0.0–4.0)

## 2019-08-14 NOTE — Progress Notes (Signed)
Called and left message that labs were normal and provided the phone number to call for physical therapy. Mirian Mo, MD

## 2019-08-15 ENCOUNTER — Encounter: Payer: Self-pay | Admitting: Neurology

## 2019-08-15 ENCOUNTER — Institutional Professional Consult (permissible substitution): Payer: Self-pay | Admitting: Neurology

## 2019-08-16 ENCOUNTER — Telehealth: Payer: Self-pay | Admitting: Family Medicine

## 2019-08-16 NOTE — Telephone Encounter (Signed)
Patient calling to check on his referral for PT. He says he still hasn't heard from anyone to schedule his appt. Please call patient back at [phone].

## 2019-08-16 NOTE — Telephone Encounter (Signed)
Called and spoke with Victorino Dike at [facility] rehab center.  Patient's referral was being processed.  Appt made and given to patient and wife.  Lorn Butcher,CMA

## 2019-08-27 ENCOUNTER — Ambulatory Visit: Payer: BC Managed Care – PPO

## 2019-09-02 ENCOUNTER — Telehealth: Payer: Self-pay | Admitting: Family Medicine

## 2019-09-02 NOTE — Telephone Encounter (Cosign Needed)
Pt submitted Attending Physician's  Statement form to be completed; last appt 08/13/19; Form placed in the blue team folder

## 2019-09-02 NOTE — Telephone Encounter (Signed)
Clinical info completed on work form.  Place form in Dr. Frank's box for completion.  Amair Shrout, CMA  

## 2019-09-03 ENCOUNTER — Ambulatory Visit: Payer: BC Managed Care – PPO | Attending: Physical Therapy | Admitting: Physical Therapy

## 2019-09-03 ENCOUNTER — Encounter: Payer: Self-pay | Admitting: Gastroenterology

## 2019-09-06 ENCOUNTER — Encounter: Payer: Self-pay | Admitting: Family Medicine

## 2019-09-06 ENCOUNTER — Ambulatory Visit (INDEPENDENT_AMBULATORY_CARE_PROVIDER_SITE_OTHER): Payer: Self-pay | Admitting: Family Medicine

## 2019-09-06 ENCOUNTER — Other Ambulatory Visit: Payer: Self-pay

## 2019-09-06 VITALS — BP 122/76 | HR 101 | Ht 68.0 in | Wt 243.0 lb

## 2019-09-06 DIAGNOSIS — I639 Cerebral infarction, unspecified: Secondary | ICD-10-CM

## 2019-09-06 MED ORDER — LISINOPRIL 10 MG PO TABS: 10.0000 mg | ORAL_TABLET | Freq: Every day | ORAL | 3 refills | Status: DC

## 2019-09-06 NOTE — Patient Instructions (Addendum)
Stroke management: You can stop taking your Plavix every day. I want you to continue taking aspirin every day. With regard to work, I do not think it safe for you to return to work at this time. I have filled out your FMLA paperwork. In order for you to be considered to return to work, you need to first be seen by neurology and physical therapy. I think it is possible that you will not be able to return to work. 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  I would like to follow-up in 1 month to make sure that you are getting connected with all the appropriate resources and a followed up with neurology and physical therapy. After that, we can follow-up every 6 months or year.

## 2019-09-06 NOTE — Assessment & Plan Note (Addendum)
Today he was informed that he cannot go back to work yet and he may not ever be cleared to go back to work.  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  He did not show up for his new patient visit with neurology and was dismissed from the practice.  A new referral was placed to neurology.  FMLA paperwork filled out and returned to him today.

## 2019-09-06 NOTE — Telephone Encounter (Signed)
Form completed by provider and original given to patient while here for an appointment today.  A copy was made and placed in the scan pile.  Xion Debruyne,CMA

## 2019-09-06 NOTE — Progress Notes (Signed)
    SUBJECTIVE:   CHIEF COMPLAINT / HPI:   Ongoing stroke work-up [name] has been in seen in clinic multiple times after leaving AMA from the hospital in the middle of a stroke work-up.  Since her last visit, he missed his appointment with neurology.  He has never been assessed by neurology since leaving AMA from the hospital.  We have completed the standard stroke work-up.  He has been connected with physical therapy but has not started physical therapy sessions because he recently lost his insurance when he lost his job.  Today he wants to know if he can go back to work.  He continues to note significant left-sided deficits and some trouble walking.  PERTINENT  PMH / PSH: Recent CVA, obstructive sleep apnea, hypertension  OBJECTIVE:   BP 122/76   Pulse (!) 101 Comment: provider informed  Ht 5\' 8"  (1.727 m)   Wt 243 lb (110.2 kg)   SpO2 98%   BMI 36.95 kg/m    General: Alert and cooperative and appears to be in no acute distress HEENT: Neck non-tender without lymphadenopathy, masses or thyromegaly Cardio: Normal S1 and S2, no S3 or S4. Rhythm is regular. No murmurs or rubs.   Neuro: Cranial nerves grossly intact.  Extraocular muscles intact. Upper extremities: 4/5 strength on the left upper extremity for grip strength, elbow flexion, elbow extension. Lower extremities: 4/5 strength on left lower extremity for hip flexion, knee flexion, knee extension, dorsiflexion.   ASSESSMENT/PLAN:   Stroke (cerebrum) [facility]) Today he was informed that he cannot go back to work yet and he may not ever be cleared to go back to work.  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  He did not show up for his new patient visit with neurology and was dismissed from the practice.  A new referral was placed to neurology.  FMLA paperwork filled out and returned to him today.     [facility],  MD [facility]

## 2019-09-09 ENCOUNTER — Encounter: Payer: Self-pay | Admitting: Neurology

## 2019-09-27 ENCOUNTER — Encounter: Payer: Self-pay | Admitting: Family Medicine

## 2019-09-27 ENCOUNTER — Other Ambulatory Visit: Payer: Self-pay

## 2019-09-27 ENCOUNTER — Ambulatory Visit (INDEPENDENT_AMBULATORY_CARE_PROVIDER_SITE_OTHER): Payer: Self-pay | Admitting: Family Medicine

## 2019-09-27 VITALS — BP 124/80 | HR 96 | Ht 68.0 in | Wt 234.4 lb

## 2019-09-27 DIAGNOSIS — Z794 Long term (current) use of insulin: Secondary | ICD-10-CM

## 2019-09-27 DIAGNOSIS — R42 Dizziness and giddiness: Secondary | ICD-10-CM | POA: Diagnosis not present

## 2019-09-27 DIAGNOSIS — E1165 Type 2 diabetes mellitus with hyperglycemia: Secondary | ICD-10-CM

## 2019-09-27 LAB — POCT GLYCOSYLATED HEMOGLOBIN (HGB A1C): Hemoglobin A1C: 13.2 % — AB (ref 4.0–5.6)

## 2019-09-27 LAB — GLUCOSE, POCT (MANUAL RESULT ENTRY): POC Glucose: 349 mg/dl — AB (ref 70–99)

## 2019-09-27 MED ORDER — BAYER CONTOUR LINK 2.4 W/DEVICE KIT: PACK | 0 refills | Status: AC

## 2019-09-27 MED ORDER — TRESIBA FLEXTOUCH 100 UNIT/ML ~~LOC~~ SOPN: 10.0000 [IU] | PEN_INJECTOR | Freq: Every day | SUBCUTANEOUS | 0 refills | Status: DC

## 2019-09-27 MED ORDER — MICROLET LANCETS MISC: 1 refills | Status: AC

## 2019-09-27 MED ORDER — CONTOUR NEXT TEST VI STRP: ORAL_STRIP | 12 refills | Status: DC

## 2019-09-27 MED ORDER — PEN NEEDLES 31G X 6 MM MISC: 1 refills | Status: DC

## 2019-09-27 MED ORDER — TRESIBA FLEXTOUCH 100 UNIT/ML ~~LOC~~ SOPN: 10.0000 [IU] | PEN_INJECTOR | Freq: Every day | SUBCUTANEOUS | 11 refills | Status: DC

## 2019-09-27 NOTE — Patient Instructions (Addendum)
It was wonderful to see you today.  Please bring ALL of your medications with you to every visit.   Today we talked about:  Starting insulin  Your medication is called Evaristo Bury- inject 10 units on Saturday morning.  Check your blood sugar every single morning   Increase your insulin dose by 1 unit each day if your sugar is over 100 in the morning  If your sugar is 100 or less in the morning, inject the SAME amount of insulin you injected the day before   Go to the ER if you have chest pain, uncontrolled nausea, vomiting, or feel dizzy again  Follow up on Tuesday morning  You can call [facility] and ask to speak to the family medicine resident on call if you have concerns or questions [phone]   If you sugar is <70, have a small amount of orange juice or other SUGARY beverage Otherwise please avoid sodas, juice, alcohol  Think about quitting smoking!   You can continue your metformin and oral medications     Thank you for choosing [facility].   Please call [phone] with any questions about today's appointment.  Please be sure to schedule follow up at the front  desk before you leave today.   Terisa Starr, MD  Family Medicine   Insulin Degludec injection What is this medicine? INSULIN DEGLUDEC (IN su lin de GLOO dek) is a human-made form of insulin. This drug lowers the amount of sugar in your blood. It is a long-acting insulin that is usually given once a day. This medicine may be used for other purposes; ask your health care provider or pharmacist if you have questions. COMMON BRAND NAME(S): Evaristo Bury What should I tell my health care provider before I take this medicine? They need to know if you have any of these conditions:  episodes of low blood sugar  eye disease, vision problems  kidney disease  liver disease  an unusual or allergic reaction to insulin, other medicines, foods, dyes, or preservatives  pregnant or trying to get  pregnant  breast-feeding How should I use this medicine? This medicine is for injection under the skin. Use exactly as directed. This insulin should never be mixed in the same syringe with other insulins before injection. Do not vigorously shake before use. You will be taught how to use this medicine and how to adjust doses for activities and illness. Do not use more insulin than prescribed. Always check the appearance of your insulin before using it. This medicine should be clear and colorless like water. Do not use it if it is cloudy, thickened, colored, or has solid particles in it. If you use an insulin pen, be sure to take off the outer needle cover before using the dose. It is important that you put your used needles and syringes in a special sharps container. Do not put them in a trash can. If you do not have a sharps container, call your pharmacist or healthcare provider to get one. This drug comes with INSTRUCTIONS FOR USE. Ask your pharmacist for directions on how to use this drug. Read the information carefully. Talk to your pharmacist or health care provider if you have questions. Talk to your pediatrician regarding the use of this medicine in children. While this drug may be prescribed for children as young as 1 year for selected conditions, precautions do apply. Overdosage: If you think you have taken too much of this medicine contact a poison control center or  emergency room at once. NOTE: This medicine is only for you. Do not share this medicine with others. What if I miss a dose? For adults: If you miss a dose, take it as soon as you can. Make sure your next dose is taken at least 8 hours later. Do not take double or extra doses. For adolescents and children: It is important not to miss a dose. Your health care professional or doctor should discuss a plan for missed doses with you. If you do miss a dose, follow their plan. Do not take double doses. What may interact with this  medicine?  other medicines for diabetes Many medications may cause changes in blood sugar, these include:  alcohol containing beverages  antiviral medicines for HIV or AIDS  aspirin and aspirin-like drugs  certain medicines for blood pressure, heart disease, irregular heart beat  chromium  diuretics  male hormones, such as estrogens or progestins, birth control pills  fenofibrate  gemfibrozil  isoniazid  lanreotide  male hormones or anabolic steroids  MAOIs like Carbex, Eldepryl, Marplan, Nardil, and Parnate  medicines for weight loss  medicines for allergies, asthma, cold, or cough  medicines for depression, anxiety, or psychotic disturbances  niacin  nicotine  NSAIDs, medicines for pain and inflammation, like ibuprofen or naproxen  octreotide  pasireotide  pentamidine  phenytoin  probenecid  quinolone antibiotics such as ciprofloxacin, levofloxacin, ofloxacin  some herbal dietary supplements  steroid medicines such as prednisone or cortisone  sulfamethoxazole; trimethoprim  thyroid hormones Some medications can hide the warning symptoms of low blood sugar (hypoglycemia). You may need to monitor your blood sugar more closely if you are taking one of these medications. These include:  beta-blockers, often used for high blood pressure or heart problems (examples include atenolol, metoprolol, propranolol)  clonidine  guanethidine  reserpine This list may not describe all possible interactions. Give your health care provider a list of all the medicines, herbs, non-prescription drugs, or dietary supplements you use. Also tell them if you smoke, drink alcohol, or use illegal drugs. Some items may interact with your medicine. What should I watch for while using this medicine? Visit your health care professional or doctor for regular checks on your progress. A test called the HbA1C (A1C) will be monitored. This is a simple blood test. It measures  your blood sugar control over the last 2 to 3 months. You will receive this test every 3 to 6 months. Learn how to check your blood sugar. Learn the symptoms of low and high blood sugar and how to manage them. Always carry a quick-source of sugar with you in case you have symptoms of low blood sugar. Examples include hard sugar candy or glucose tablets. Make sure others know that you can choke if you eat or drink when you develop serious symptoms of low blood sugar, such as seizures or unconsciousness. They must get medical help at once. Tell your doctor or health care professional if you have high blood sugar. You might need to change the dose of your medicine. If you are sick or exercising more than usual, you might need to change the dose of your medicine. Do not skip meals. Ask your doctor or health care professional if you should avoid alcohol. Many nonprescription cough and cold products contain sugar or alcohol. These can affect blood sugar. Make sure that you have the right kind of syringe for the type of insulin you use. Try not to change the brand and type of insulin or  syringe unless your health care professional or doctor tells you to. Switching insulin brand or type can cause dangerously high or low blood sugar. Always keep an extra supply of insulin, syringes, and needles on hand. Use a syringe one time only. Throw away syringe and needle in a closed container to prevent accidental needle sticks. Insulin pens and cartridges should never be shared. Even if the needle is changed, sharing may result in passing of viruses like hepatitis or HIV. Each time you get a new box of pen needles, check to see if they are the same type as the ones you were trained to use. If not, ask your health care professional to show you how to use this new type properly. Wear a medical ID bracelet or chain, and carry a card that describes your disease and details of your medicine and dosage times. What side effects may  I notice from receiving this medicine? Side effects that you should report to your doctor or health care professional as soon as possible:  allergic reactions like skin rash, itching or hives, swelling of the face, lips, or tongue  breathing problems  signs and symptoms of high blood sugar such as dizziness, dry mouth, dry skin, fruity breath, nausea, stomach pain, increased hunger or thirst, increased urination  signs and symptoms of low blood sugar such as feeling anxious, confusion, dizziness, increased hunger, unusually weak or tired, sweating, shakiness, cold, irritable, headache, blurred vision, fast heartbeat, loss of consciousness Side effects that usually do not require medical attention (report to your doctor or health care professional if they continue or are bothersome):  increase or decrease in fatty tissue under the skin due to overuse of a particular injection site  itching, burning, swelling, or rash at site where injected This list may not describe all possible side effects. Call your doctor for medical advice about side effects. You may report side effects to FDA at 1-800-FDA-1088. Where should I keep my medicine? Keep out of the reach of children. Unopened Vials: Tresiba vials: Store in a refrigerator between 2 and 8 degrees C (36 and 46 degrees F) or at room temperature below 30 degrees C (86 degrees F). Do not freeze or use if the insulin has been frozen. Protect from light and excessive heat. If stored at room temperature, the vial must be discarded after 56 days (8 weeks). Throw away any unopened and unused medicine that has been stored in the refrigerator after the expiration date. Unopened Pens: [place] FlexTouch pens: Store in a refrigerator between 2 and 8 degrees C (36 and 46 degrees F) or at room temperature below 30 degrees C (86 degrees F). Do not freeze or use if the insulin has been frozen. Protect from light and excessive heat. If stored at room temperature, the  pen must be discarded after 56 days (8 weeks). Throw away any unopened and unused medicine that has been stored in the refrigerator after the expiration date. Vials that you are using: Tresiba vials: Store in a refrigerator or at room temperature below 30 degrees C (86 degrees F). Do not freeze. Keep away from heat and light. Throw the opened vial away after 56 days (8 weeks). Pens that you are using: [place] FlexTouch pens: Store in a refrigerator or at room temperature below 30 degrees C (86 degrees F). Do not freeze. Keep away from heat and light. Throw the pen away after 56 days (8 weeks), even if it still has insulin left in it. NOTE: This sheet  is a summary. It may not cover all possible information. If you have questions about this medicine, talk to your doctor, pharmacist, or health care provider.  2020 Elsevier/Gold Standard (2018-09-18 08:05:09)

## 2019-09-27 NOTE — Progress Notes (Signed)
Asked by Dr. Manson Passey to educate and assist with insulin injection training.   Patient and spouse (on phone) were educated on  purpose, proper use and potential adverse effects including hypoglycemia.  Following instruction patient verbalized understanding of treatment plan and effectively administered first dose in office.  He will increase 1 unit per day from today's 10 unit dose if his fasting blood sugar is > 150mg /dl.

## 2019-09-27 NOTE — Progress Notes (Signed)
SUBJECTIVE:   CHIEF COMPLAINT / HPI:   Samuel Maldonado is a 44 year old with history of type 2 diabetes (previously well controlled), CVA (June),  HTN, HLD presenting with nausea and feeling 'unwell'.  He reports a several week history polyuria and polydipsia. He was evaluate at [facility] ED where BG was found to be 741. He was given insulin and IV fluids, discharged with CBG of 338, Creatinine of 1.27, hyponatremia (corrects to normal) and kyperkalemia-5.6. Bicarbonate was 27, AG was 10.   Today, he reports significant improvement polyuria and polydipsia. Endorses some fatigue, dry mouth, intermittent blurry vision. He checks his BG at home, has not been below 300. He nausea with food but no emesis. Specifically denies recent illness, fevers, chest pain, vomiting, syncope. Reports improvement dizziness. He is drinking fluids and eating. No EtOH use. 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 He is taking oral medications.   PERTINENT  PMH / PSH/Family/Social History : Reviewed and updated.  Current Outpatient Medications:  .  acetaminophen (TYLENOL) 325 MG tablet, Take 650 mg by mouth every 6 (six) hours as needed for mild pain., Disp: , Rfl:  .  aspirin 81 MG EC tablet, Take 1 tablet (81 mg total) by mouth daily., Disp: 21 tablet, Rfl: 0 .  atorvastatin (LIPITOR) 20 MG tablet, Take 20 mg by mouth daily., Disp: , Rfl:  .  Blood Glucose Monitoring Suppl (BAYER CONTOUR LINK 2.4) w/Device KIT, Use to check once daily, Disp: 1 kit, Rfl: 0 .  clopidogrel (PLAVIX) 75 MG tablet, Take 1 tablet (75 mg total) by mouth daily., Disp: 90 tablet, Rfl: 3 .  gabapentin (NEURONTIN) 100 MG capsule, Take 1 capsule (100 mg total) by mouth 3 (three) times daily., Disp: 90 capsule, Rfl: 3 .  glipiZIDE (GLUCOTROL) 10 MG tablet, Take 10 mg by mouth daily., Disp: , Rfl:  .  glucose blood (CONTOUR NEXT TEST) test strip, Use as instructed, Disp: 100 each, Rfl: 12 .  insulin degludec (TRESIBA FLEXTOUCH) 100 UNIT/ML FlexTouch  Pen, Inject 10 Units into the skin daily. Increase by 1 unit each day until blood glucose in morning reaches 100., Disp: 3 mL, Rfl: 0 .  Insulin Pen Needle (PEN NEEDLES) 31G X 6 MM MISC, Use to inject insulin once daily, Disp: 100 each, Rfl: 1 .  lisinopril (ZESTRIL) 10 MG tablet, Take 1 tablet (10 mg total) by mouth daily., Disp: 90 tablet, Rfl: 3 .  metFORMIN (GLUCOPHAGE) 500 MG tablet, Take 500 mg by mouth 2 (two) times daily., Disp: , Rfl:  .  Microlet Lancets MISC, Use to check blood sugar once daily in AM, Disp: 100 each, Rfl: 1 .  sitaGLIPtin (JANUVIA) 100 MG tablet, Take 100 mg by mouth daily at 12 noon., Disp: , Rfl:  .  tadalafil (CIALIS) 20 MG tablet, Take 20 mg by mouth daily as needed for erectile dysfunction., Disp: , Rfl:  .  tamsulosin (FLOMAX) 0.4 MG CAPS capsule, Take 1 capsule (0.4 mg total) by mouth daily., Disp: 60 capsule, Rfl: 3   OBJECTIVE:   BP 124/80   Pulse 96   Ht 5' 8"  (1.727 m)   Wt 234 lb 6.4 oz (106.3 kg)   SpO2 99%   BMI 35.64 kg/m   HEENT: Sclera anicteric. Dentition is moderate. Appears well hydrated. Neck: Supple Cardiac: Regular rate and rhythm. Normal S1/S2. No murmurs, rubs, or gallops appreciated. Lungs: Clear bilaterally to ascultation.  Abdomen: Normoactive bowel sounds. No tenderness to deep or light  palpation. No rebound or guarding. No masses, no RUQ tenderness.  Extremities: Warm, well perfused without edema.  Skin: Warm, dry Psych: Pleasant and appropriate   Neuro: CN II: PERRL CN III, IV,VI: EOMI CV V: Normal sensation in V1, V2, V3 CVII: Symmetric smile and brow raise CN VIII: Normal hearing CN IX,X: Symmetric palate raise  CN XI: 5/5 shoulder shrug CN XII: Symmetric tongue protrusion  UE and LE strength 5/5 2+ UE and LE reflexes  Normal sensation in UE and LE bilaterally  No ataxia with finger to nose, normal heel to shin  Negative Rhomberg   Data- Personally reviewed ED notes A1C 13.2 CBG >300    ASSESSMENT/PLAN:    Type 2 diabetes mellitus with hyperglycemia ([place]) Discussed at length--unclear as to trigger of worsening DM. Discussed long term complications. Given symptoms, reviewed risk of complications from hyperglycemia. Reviewed ED precautions. Will plan to call with labs in AM Reviewed records--severe hyperglycemia without DKA, now improved but still with symptomatic hyperglycemia  Creatinine still appropriate for oral medications- continue Started Tresiba--at 10 units AM, first dose given by patient with Dr. 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   At follow up- continue discuss smoking cessation, need PCV23, Influenza, foot and eye exam in next several months   Dorris Singh, MD  [place]

## 2019-09-27 NOTE — Assessment & Plan Note (Signed)
Discussed at length--unclear as to trigger of worsening DM. Discussed long term complications. Given symptoms, reviewed risk of complications from hyperglycemia. Reviewed ED precautions. Will plan to call with labs in AM  Creatinine still appropriate for oral medications- continue Started Tresiba--at 10 units AM, first dose given by patient with Dr. Raymondo Band, titrate by 1 unit daily for BG >100 Patient is already on a statin BMP today, will call with results

## 2019-09-28 LAB — HEPATIC FUNCTION PANEL
ALT: 33 IU/L (ref 0–44)
AST: 18 IU/L (ref 0–40)
Albumin: 4.6 g/dL (ref 4.0–5.0)
Alkaline Phosphatase: 83 IU/L (ref 48–121)
Bilirubin Total: 0.4 mg/dL (ref 0.0–1.2)
Bilirubin, Direct: 0.15 mg/dL (ref 0.00–0.40)
Total Protein: 7.1 g/dL (ref 6.0–8.5)

## 2019-09-28 LAB — CBC
Hematocrit: 41.8 % (ref 37.5–51.0)
Hemoglobin: 14 g/dL (ref 13.0–17.7)
MCH: 26.2 pg — ABNORMAL LOW (ref 26.6–33.0)
MCHC: 33.5 g/dL (ref 31.5–35.7)
MCV: 78 fL — ABNORMAL LOW (ref 79–97)
Platelets: 335 10*3/uL (ref 150–450)
RBC: 5.34 x10E6/uL (ref 4.14–5.80)
RDW: 13.1 % (ref 11.6–15.4)
WBC: 6.1 10*3/uL (ref 3.4–10.8)

## 2019-09-28 LAB — FERRITIN: Ferritin: 450 ng/mL — ABNORMAL HIGH (ref 30–400)

## 2019-09-28 LAB — BASIC METABOLIC PANEL
BUN/Creatinine Ratio: 16 (ref 9–20)
BUN: 16 mg/dL (ref 6–24)
CO2: 23 mmol/L (ref 20–29)
Calcium: 9.8 mg/dL (ref 8.7–10.2)
Chloride: 96 mmol/L (ref 96–106)
Creatinine, Ser: 0.97 mg/dL (ref 0.76–1.27)
GFR calc Af Amer: 108 mL/min/{1.73_m2} (ref >59)
GFR calc non Af Amer: 93 mL/min/{1.73_m2} (ref >59)
Glucose: 371 mg/dL — ABNORMAL HIGH (ref 65–99)
Potassium: 4.8 mmol/L (ref 3.5–5.2)
Sodium: 134 mmol/L (ref 134–144)

## 2019-09-28 MED ORDER — TRESIBA FLEXTOUCH 100 UNIT/ML ~~LOC~~ SOPN: 10.0000 [IU] | PEN_INJECTOR | Freq: Every day | SUBCUTANEOUS | 0 refills | Status: DC

## 2019-09-28 NOTE — Telephone Encounter (Signed)
Called patient with results. Hyperglycemia on BMP, otherwise stable. Has microcytosis with elevated ferritin, suspect he may have a thalassemia trait + ACD.  Also sent refill per request.  All questions answered about insulin titration--he successfully injected this AM.   Reviewed return precautions.  Terisa Starr, MD  Family Medicine Teaching Service

## 2019-10-01 ENCOUNTER — Telehealth: Payer: Self-pay

## 2019-10-01 ENCOUNTER — Other Ambulatory Visit: Payer: Self-pay

## 2019-10-01 ENCOUNTER — Ambulatory Visit: Payer: Self-pay | Admitting: Family Medicine

## 2019-10-01 NOTE — Telephone Encounter (Signed)
Received phone call from pharmacist due to patient's tresiba rx. Pharmacist states that due to insurance purposes there has to be a maximum daily dosage specified.   Please advise  Routing to prescribing doctor Manson Passey).   Veronda Prude, RN

## 2019-10-01 NOTE — Progress Notes (Deleted)
   SUBJECTIVE:   CHIEF COMPLAINT / HPI:   No chief complaint on file.    Samuel Maldonado is a 44 y.o. male here for ***     PERTINENT  PMH / PSH: *** , reviewed and updated as appropriate   OBJECTIVE:   There were no vitals taken for this visit.  ***  ASSESSMENT/PLAN:   No problem-specific Assessment & Plan notes found for this encounter.    *** Type 2 diabetes mellitus with hyperglycemia ([facility]) Discussed at length--unclear as to trigger of worsening DM. Discussed long term complications. Given symptoms, reviewed risk of complications from hyperglycemia. Reviewed ED precautions. Will plan to call with labs in AM Reviewed records--severe hyperglycemia without DKA, now improved but still with symptomatic hyperglycemia  Creatinine still appropriate for oral medications- continue Started Tresiba--at 10 units AM, first dose given by patient with Dr. 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

## 2019-10-01 NOTE — Telephone Encounter (Signed)
Please let pharmacy know maximal dose of 45 units per day at this time.  Thank you, Terisa Starr, MD  [facility] Medicine Teaching Service

## 2019-10-01 NOTE — Patient Outreach (Addendum)
[facility]) Care Management  10/01/2019  Kingdavid Leinbach Aug 03, 1975 [id number]   Telephone outreach to patient to obtain mRS was successfully completed. MRS=2  Domingo Cocking [facility] Care Management Assistant [phone]

## 2019-10-02 NOTE — Telephone Encounter (Signed)
Called pharmacy and informed of below.   Donja Tipping C Saliyah Gillin, RN  

## 2019-10-08 ENCOUNTER — Ambulatory Visit: Payer: Self-pay | Admitting: Family Medicine

## 2019-10-11 ENCOUNTER — Telehealth: Payer: Self-pay

## 2019-10-11 NOTE — Telephone Encounter (Signed)
Contacted by front office staff. Patients wife called to state they are in the parking lot requesting a sample [place]. Wife states they can not afford cash price. Patient has no insurance coverage. One pen given per Manson Passey. Patient advised to make a FU apt, as he missed 09/21.

## 2019-10-14 ENCOUNTER — Telehealth: Payer: Self-pay

## 2019-10-14 NOTE — Telephone Encounter (Signed)
Error

## 2019-10-21 ENCOUNTER — Other Ambulatory Visit: Payer: Self-pay

## 2019-10-21 ENCOUNTER — Encounter: Payer: Self-pay | Admitting: Neurology

## 2019-10-21 ENCOUNTER — Ambulatory Visit (INDEPENDENT_AMBULATORY_CARE_PROVIDER_SITE_OTHER): Payer: Self-pay | Admitting: Neurology

## 2019-10-21 ENCOUNTER — Telehealth: Payer: Self-pay | Admitting: Family Medicine

## 2019-10-21 VITALS — BP 127/79 | HR 113 | Ht 68.0 in | Wt 237.4 lb

## 2019-10-21 DIAGNOSIS — E1165 Type 2 diabetes mellitus with hyperglycemia: Secondary | ICD-10-CM

## 2019-10-21 DIAGNOSIS — Z794 Long term (current) use of insulin: Secondary | ICD-10-CM

## 2019-10-21 DIAGNOSIS — R531 Weakness: Secondary | ICD-10-CM
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
## 2019-10-21 NOTE — Patient Instructions (Addendum)
1.  Continue aspirin 81mg  daily, atorvastatin, diabetes and blood pressure medications. 2.Patient to check on insurance through his wife and the , [place], Or call and get [facility] Assistance. Once we know what insurance we can schedule the MRI.   3.  Check MRI of brain. 4.  Check 14 day ZIO patch/cardiac event monitor 5.  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 Start physical therapy when insurance kicks in  7  Follow up in 6 months.

## 2019-10-21 NOTE — Telephone Encounter (Signed)
Clinical info completed on Disability form.  Place form in PCP's box for completion.  Tayla Panozzo, CMA  

## 2019-10-21 NOTE — Telephone Encounter (Signed)
Short Term Disability form dropped off for at front desk for completion.  Verified that patient section of form has been completed.  Last DOS/WCC with PCP was 10/08/19.  Placed form in team folder to be completed by clinical staff.  Machelle A McCain

## 2019-10-22 NOTE — Telephone Encounter (Signed)
Filled out and placed in triage bin in front office. Mirian Mo, MD

## 2019-10-23 NOTE — Telephone Encounter (Signed)
Patient contacted and advised of form ready for pick up.

## 2019-10-29 ENCOUNTER — Encounter: Payer: Self-pay | Admitting: Physician Assistant

## 2019-11-01 ENCOUNTER — Telehealth: Payer: Self-pay

## 2019-11-01 NOTE — Telephone Encounter (Signed)
Patients wife calls nurse line requesting a Tresiba pen for patient. Per wife, there is an insurance issue taht should be resolved in ~ 1 week and they can not afford cash price at the pharmacy. I have left one pen for patient in the medication refrigerator.   Lot: [id number]  Expir: 12/16/20

## 2019-11-01 NOTE — Telephone Encounter (Signed)
Patient presents to front desk to pick up sample. Verified name and birth date and provided sample to patient.   Veronda Prude, RN

## 2019-11-05 ENCOUNTER — Encounter: Payer: Self-pay | Admitting: Gastroenterology

## 2019-11-21 ENCOUNTER — Ambulatory Visit: Payer: Self-pay | Admitting: Physician Assistant

## 2019-11-27 NOTE — Telephone Encounter (Signed)
Patient called and informed samples can be provided. I have placed samples in med refrigerator with patient's name for pick up.    Drug name: Evaristo Bury  Strength: 100 unit/mL  Qty: 2 pens LOT: [id number] Exp.Date: 03/16/2021  Dosing instructions: Inject 10 units into the skin daily. Increase by 1 unit each day until blood glucose in morning reaches 100.  The patient has been instructed regarding the correct time, dose, and frequency of taking this medication, including desired effects and most common side effects.   Samuel Maldonado Samuel Maldonado 3:27 PM 11/27/2019

## 2019-11-27 NOTE — Telephone Encounter (Signed)
Patient's wife calls nurse line requesting additional sample of [place]. Patient's wife reports they are still in the process of switching insurance providers and having patient added to policy. Please advise if samples can be dispensed for patient.   Please contact patient at [phone]  Veronda Prude, RN

## 2019-11-27 NOTE — Telephone Encounter (Signed)
It is okay to provide additional insulin samples.  Please provide at least 1 month of samples.  Please have him return to clinic in 1 month for a diabetes follow-up.  Do you need me to call him for anything?  Mirian Mo, MD

## 2019-12-15 ENCOUNTER — Emergency Department (HOSPITAL_COMMUNITY)
Admission: EM | Admit: 2019-12-15 | Discharge: 2019-12-16 | Disposition: A | Discharge status: Home / Self Care | Payer: Self-pay | Attending: Emergency Medicine | Admitting: Emergency Medicine

## 2019-12-15 ENCOUNTER — Emergency Department (HOSPITAL_COMMUNITY): Payer: Self-pay

## 2019-12-15 ENCOUNTER — Other Ambulatory Visit: Payer: Self-pay

## 2019-12-15 ENCOUNTER — Encounter (HOSPITAL_COMMUNITY): Payer: Self-pay | Admitting: Emergency Medicine

## 2019-12-15 DIAGNOSIS — Z7982 Long term (current) use of aspirin: Secondary | ICD-10-CM | POA: Insufficient documentation

## 2019-12-15 DIAGNOSIS — F17210 Nicotine dependence, cigarettes, uncomplicated: Secondary | ICD-10-CM | POA: Insufficient documentation

## 2019-12-15 DIAGNOSIS — R739 Hyperglycemia, unspecified: Secondary | ICD-10-CM

## 2019-12-15 DIAGNOSIS — Z79899 Other long term (current) drug therapy: Secondary | ICD-10-CM | POA: Insufficient documentation

## 2019-12-15 DIAGNOSIS — E1165 Type 2 diabetes mellitus with hyperglycemia: Secondary | ICD-10-CM | POA: Insufficient documentation

## 2019-12-15 DIAGNOSIS — I10 Essential (primary) hypertension: Secondary | ICD-10-CM | POA: Insufficient documentation

## 2019-12-15 DIAGNOSIS — Z20822 Contact with and (suspected) exposure to covid-19: Secondary | ICD-10-CM | POA: Insufficient documentation

## 2019-12-15 DIAGNOSIS — Z7984 Long term (current) use of oral hypoglycemic drugs: Secondary | ICD-10-CM | POA: Insufficient documentation

## 2019-12-15 DIAGNOSIS — E860 Dehydration: Secondary | ICD-10-CM | POA: Insufficient documentation

## 2019-12-15 LAB — RESP PANEL BY RT-PCR (FLU A&B, COVID) ARPGX2
Influenza A by PCR: NEGATIVE
Influenza B by PCR: NEGATIVE
SARS Coronavirus 2 by RT PCR: NEGATIVE

## 2019-12-15 LAB — URINALYSIS, ROUTINE W REFLEX MICROSCOPIC
Bacteria, UA: NONE SEEN
Bilirubin Urine: NEGATIVE
Glucose, UA: >=500 mg/dL — AB
Hgb urine dipstick: NEGATIVE
Ketones, ur: NEGATIVE mg/dL
Leukocytes,Ua: NEGATIVE
Nitrite: NEGATIVE
Protein, ur: NEGATIVE mg/dL
Specific Gravity, Urine: 1.027 (ref 1.005–1.030)
pH: 5 (ref 5.0–8.0)

## 2019-12-15 LAB — CBG MONITORING, ED
Glucose-Capillary: >600 mg/dL (ref 70–99)
Glucose-Capillary: 534 mg/dL (ref 70–99)

## 2019-12-15 LAB — CBC WITH DIFFERENTIAL/PLATELET
Abs Immature Granulocytes: 0.04 10*3/uL (ref 0.00–0.07)
Basophils Absolute: 0 10*3/uL (ref 0.0–0.1)
Basophils Relative: 0 %
Eosinophils Absolute: 0.2 10*3/uL (ref 0.0–0.5)
Eosinophils Relative: 2 %
HCT: 46 % (ref 39.0–52.0)
Hemoglobin: 14.9 g/dL (ref 13.0–17.0)
Immature Granulocytes: 1 %
Lymphocytes Relative: 29 %
Lymphs Abs: 2.4 10*3/uL (ref 0.7–4.0)
MCH: 26.2 pg (ref 26.0–34.0)
MCHC: 32.4 g/dL (ref 30.0–36.0)
MCV: 80.8 fL (ref 80.0–100.0)
Monocytes Absolute: 0.5 10*3/uL (ref 0.1–1.0)
Monocytes Relative: 7 %
Neutro Abs: 5.1 10*3/uL (ref 1.7–7.7)
Neutrophils Relative %: 61 %
Platelets: 349 10*3/uL (ref 150–400)
RBC: 5.69 MIL/uL (ref 4.22–5.81)
RDW: 13.9 % (ref 11.5–15.5)
WBC: 8.2 10*3/uL (ref 4.0–10.5)
nRBC: 0 % (ref 0.0–0.2)

## 2019-12-15 LAB — COMPREHENSIVE METABOLIC PANEL
ALT: 21 U/L (ref 0–44)
AST: 15 U/L (ref 15–41)
Albumin: 4.5 g/dL (ref 3.5–5.0)
Alkaline Phosphatase: 73 U/L (ref 38–126)
Anion gap: 13 (ref 5–15)
BUN: 31 mg/dL — ABNORMAL HIGH (ref 6–20)
CO2: 22 mmol/L (ref 22–32)
Calcium: 9.7 mg/dL (ref 8.9–10.3)
Chloride: 92 mmol/L — ABNORMAL LOW (ref 98–111)
Creatinine, Ser: 1.2 mg/dL (ref 0.61–1.24)
GFR, Estimated: >60 mL/min (ref >60)
Glucose, Bld: 738 mg/dL (ref 70–99)
Potassium: 4.7 mmol/L (ref 3.5–5.1)
Sodium: 127 mmol/L — ABNORMAL LOW (ref 135–145)
Total Bilirubin: 0.5 mg/dL (ref 0.3–1.2)
Total Protein: 8.3 g/dL — ABNORMAL HIGH (ref 6.5–8.1)

## 2019-12-15 LAB — BLOOD GAS, VENOUS
Acid-Base Excess: 0.5 mmol/L (ref 0.0–2.0)
Bicarbonate: 24.2 mmol/L (ref 20.0–28.0)
FIO2: 21
O2 Saturation: 94.7 %
Patient temperature: 98.6
pCO2, Ven: 38.2 mmHg — ABNORMAL LOW (ref 44.0–60.0)
pH, Ven: 7.419 (ref 7.250–7.430)
pO2, Ven: 73 mmHg — ABNORMAL HIGH (ref 32.0–45.0)

## 2019-12-15 LAB — BETA-HYDROXYBUTYRIC ACID: Beta-Hydroxybutyric Acid: 0.26 mmol/L (ref 0.05–0.27)

## 2019-12-15 LAB — MAGNESIUM: Magnesium: 2.4 mg/dL (ref 1.7–2.4)

## 2019-12-15 MED ORDER — DEXTROSE IN LACTATED RINGERS 5 % IV SOLN: INTRAVENOUS | Status: DC

## 2019-12-15 MED ORDER — DEXTROSE 50 % IV SOLN: 0.0000 mL | INTRAVENOUS | Status: DC | PRN

## 2019-12-15 MED ORDER — INSULIN ASPART 100 UNIT/ML ~~LOC~~ SOLN
5.0000 [IU] | Freq: Once | SUBCUTANEOUS | Status: AC
  Administered 2019-12-15: 5 [IU] via SUBCUTANEOUS
  Filled 2019-12-15: qty 0.05

## 2019-12-15 MED ORDER — LACTATED RINGERS IV SOLN: INTRAVENOUS | Status: DC

## 2019-12-15 MED ORDER — INSULIN REGULAR(HUMAN) IN NACL 100-0.9 UT/100ML-% IV SOLN: INTRAVENOUS | Status: DC

## 2019-12-15 MED ORDER — SODIUM CHLORIDE 0.9 % IV BOLUS
1000.0000 mL | Freq: Once | INTRAVENOUS | Status: AC
  Administered 2019-12-15: 1000 mL via INTRAVENOUS
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
## 2019-12-15 NOTE — ED Triage Notes (Signed)
Patient c/o hyperglycemia and dehydration. Denies N/V/D. CBG >600 in triage. States last took insulin this morning.

## 2019-12-16 LAB — CBG MONITORING, ED: Glucose-Capillary: 420 mg/dL — ABNORMAL HIGH (ref 70–99)

## 2019-12-16 LAB — OSMOLALITY: Osmolality: 322 mOsm/kg (ref 275–295)

## 2019-12-16 NOTE — Discharge Instructions (Signed)
As we discussed today I suspect that your high sugars are, at least in part, caused by consuming foods/liquids that are high in sugar, specifically sweet tea.  As we discussed you can continue to eat and drink the things that you like in moderation however it is important to limit these to healthy amounts.  Please schedule a follow-up appointment with your primary care doctor.  Please check your blood sugar every day.  If your symptoms worsen, you develop any new concerns please seek additional medical care and evaluation.

## 2019-12-17 ENCOUNTER — Other Ambulatory Visit: Payer: Self-pay

## 2019-12-17 ENCOUNTER — Emergency Department (HOSPITAL_COMMUNITY)
Admission: EM | Admit: 2019-12-17 | Discharge: 2019-12-17 | Disposition: A | Discharge status: Home / Self Care | Payer: Self-pay | Attending: Emergency Medicine | Admitting: Emergency Medicine

## 2019-12-17 ENCOUNTER — Encounter (HOSPITAL_COMMUNITY): Payer: Self-pay

## 2019-12-17 DIAGNOSIS — I10 Essential (primary) hypertension: Secondary | ICD-10-CM | POA: Insufficient documentation

## 2019-12-17 DIAGNOSIS — Z79899 Other long term (current) drug therapy: Secondary | ICD-10-CM | POA: Insufficient documentation

## 2019-12-17 DIAGNOSIS — Z7982 Long term (current) use of aspirin: Secondary | ICD-10-CM | POA: Insufficient documentation

## 2019-12-17 DIAGNOSIS — Z8673 Personal history of transient ischemic attack (TIA), and cerebral infarction without residual deficits: Secondary | ICD-10-CM | POA: Insufficient documentation

## 2019-12-17 DIAGNOSIS — F17210 Nicotine dependence, cigarettes, uncomplicated: Secondary | ICD-10-CM | POA: Insufficient documentation

## 2019-12-17 DIAGNOSIS — R739 Hyperglycemia, unspecified: Secondary | ICD-10-CM

## 2019-12-17 DIAGNOSIS — E1165 Type 2 diabetes mellitus with hyperglycemia: Secondary | ICD-10-CM | POA: Insufficient documentation

## 2019-12-17 DIAGNOSIS — Z794 Long term (current) use of insulin: Secondary | ICD-10-CM | POA: Insufficient documentation

## 2019-12-17 LAB — BASIC METABOLIC PANEL
Anion gap: 12 (ref 5–15)
BUN: 28 mg/dL — ABNORMAL HIGH (ref 6–20)
CO2: 22 mmol/L (ref 22–32)
Calcium: 9.8 mg/dL (ref 8.9–10.3)
Chloride: 93 mmol/L — ABNORMAL LOW (ref 98–111)
Creatinine, Ser: 1.16 mg/dL (ref 0.61–1.24)
GFR, Estimated: >60 mL/min (ref >60)
Glucose, Bld: 629 mg/dL (ref 70–99)
Potassium: 4.9 mmol/L (ref 3.5–5.1)
Sodium: 127 mmol/L — ABNORMAL LOW (ref 135–145)

## 2019-12-17 LAB — URINALYSIS, ROUTINE W REFLEX MICROSCOPIC
Bacteria, UA: NONE SEEN
Bilirubin Urine: NEGATIVE
Glucose, UA: >=500 mg/dL — AB
Hgb urine dipstick: NEGATIVE
Ketones, ur: 5 mg/dL — AB
Leukocytes,Ua: NEGATIVE
Nitrite: NEGATIVE
Protein, ur: NEGATIVE mg/dL
Specific Gravity, Urine: 1.027 (ref 1.005–1.030)
pH: 5 (ref 5.0–8.0)

## 2019-12-17 LAB — CBC
HCT: 45.2 % (ref 39.0–52.0)
Hemoglobin: 14.7 g/dL (ref 13.0–17.0)
MCH: 26 pg (ref 26.0–34.0)
MCHC: 32.5 g/dL (ref 30.0–36.0)
MCV: 79.9 fL — ABNORMAL LOW (ref 80.0–100.0)
Platelets: 354 10*3/uL (ref 150–400)
RBC: 5.66 MIL/uL (ref 4.22–5.81)
RDW: 13.5 % (ref 11.5–15.5)
WBC: 8.1 10*3/uL (ref 4.0–10.5)
nRBC: 0 % (ref 0.0–0.2)

## 2019-12-17 LAB — CBG MONITORING, ED
Glucose-Capillary: 583 mg/dL (ref 70–99)
Glucose-Capillary: 570 mg/dL (ref 70–99)
Glucose-Capillary: 375 mg/dL — ABNORMAL HIGH (ref 70–99)

## 2019-12-17 MED ORDER — INSULIN ASPART 100 UNIT/ML ~~LOC~~ SOLN
8.0000 [IU] | Freq: Once | SUBCUTANEOUS | Status: AC
  Administered 2019-12-17: 8 [IU] via SUBCUTANEOUS
  Filled 2019-12-17: qty 0.08

## 2019-12-17 MED ORDER — LACTATED RINGERS IV BOLUS
2000.0000 mL | Freq: Once | INTRAVENOUS | Status: AC
  Administered 2019-12-17: 2000 mL via INTRAVENOUS

## 2019-12-17 NOTE — ED Notes (Signed)
Discussed with patient, per Madilyn Hook, MD, if patient does not want his CBG rechecked, that he needed to go home and eat a meal. Patient states he would like to go home and eat and will check his sugar after eating.
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
## 2019-12-17 NOTE — ED Notes (Signed)
CBG did not cross on time, most recent CBG 375

## 2019-12-17 NOTE — ED Triage Notes (Signed)
Patient states that his CBG read "high". CBG in triage-583. Patient states that he took his insulin as prescribed this AM. Patient c/o slight abdominal pain and nausea.

## 2020-01-21 ENCOUNTER — Ambulatory Visit (INDEPENDENT_AMBULATORY_CARE_PROVIDER_SITE_OTHER): Payer: Federal, State, Local not specified - PPO | Admitting: Family Medicine

## 2020-01-21 ENCOUNTER — Other Ambulatory Visit: Payer: Self-pay | Admitting: Family Medicine

## 2020-01-21 ENCOUNTER — Other Ambulatory Visit: Payer: Self-pay

## 2020-01-21 VITALS — BP 102/80 | HR 114 | Ht 68.0 in | Wt 215.4 lb

## 2020-01-21 DIAGNOSIS — Z794 Long term (current) use of insulin: Secondary | ICD-10-CM

## 2020-01-21 DIAGNOSIS — Z716 Tobacco abuse counseling: Secondary | ICD-10-CM | POA: Diagnosis not present

## 2020-01-21 DIAGNOSIS — E1165 Type 2 diabetes mellitus with hyperglycemia: Secondary | ICD-10-CM

## 2020-01-21 LAB — GLUCOSE, POCT (MANUAL RESULT ENTRY): POC Glucose: 290 mg/dl — AB (ref 70–99)

## 2020-01-21 LAB — POCT GLYCOSYLATED HEMOGLOBIN (HGB A1C): Hemoglobin A1C: 14.8 % — AB (ref 4.0–5.6)

## 2020-01-21 MED ORDER — METFORMIN HCL ER 500 MG PO TB24: 1000.0000 mg | ORAL_TABLET | Freq: Two times a day (BID) | ORAL | 3 refills | Status: DC

## 2020-01-21 MED ORDER — CHANTIX STARTING MONTH PAK 0.5 MG X 11 & 1 MG X 42 PO TABS: ORAL_TABLET | ORAL | 0 refills | Status: AC

## 2020-01-21 NOTE — Assessment & Plan Note (Signed)
A1c remains poorly controlled today at 14.8.  We discussed having more frequent visits until his blood sugar was appropriately controlled. -Increase Metformin to 1000 mg twice daily (XR formulation sent in) -Increased long-acting insulin from 20 to 25 units daily -Check CBGs every morning before breakfast, keep a log, bring it to next visit -Follow-up in 2 weeks -Plan to start either semaglutide or empagliflozin at next visit (patient preference for pills so we will likely start empagliflozin)

## 2020-01-21 NOTE — Assessment & Plan Note (Signed)
He is interested in starting medicine today that would help with smoking cessation.  We agreed to start Chantix today.  Side effects discussed. -Chantix starter pack sent to pharmacy -Quit date set for 02/21/2020

## 2020-01-21 NOTE — Progress Notes (Signed)
    SUBJECTIVE:   CHIEF COMPLAINT / HPI:   Diabetes His current diabetes medication includes: -Metformin 500 BID -Glipizide -Insulin 20 units daily -Lisinopril 10 mg He notes that he is not making any significant efforts to improve his diet or exercise.  He reports that he has been checking his blood sugars in the morning and its been between 320-360 in the past week.  Current smoker He currently smokes about 1 pack/day.  He is interested in smoking cessation.  Is not previously used Chantix but is interested in starting Chantix today.  Tachycardia He was not aware that his heart was beating quickly.  He noted that he did have a cigarette shortly before entering clinic today.  Overall, he feels well and specifically denies fever, chest pain, palpitations, recent illness.  PERTINENT  PMH / PSH: History of CVA this past year, diabetes, smoking  OBJECTIVE:   BP 102/80   Pulse (!) 114   Ht 5\' 8"  (1.727 m)   Wt 215 lb 6 oz (97.7 kg)   SpO2 97%   BMI 32.75 kg/m    General: Alert and cooperative and appears to be in no acute distress HEENT: Moist mucous membranes.  Oropharynx normal.  Poor dentition. Cardio: Normal S1 and S2, no S3 or S4. Rhythm is regular, mildly tachycardic. No murmurs or rubs.   Pulm: Clear to auscultation bilaterally, no crackles, wheezing, or diminished breath sounds. Normal respiratory effort Abdomen: Bowel sounds normal. Abdomen soft and non-tender.  Extremities: No peripheral edema. Warm/ well perfused.  Strong radial pulses. Neuro: Cranial nerves grossly intact   ASSESSMENT/PLAN:   Type 2 diabetes mellitus with hyperglycemia (HCC) A1c remains poorly controlled today at 14.8.  We discussed having more frequent visits until his blood sugar was appropriately controlled. -Increase Metformin to 1000 mg twice daily (XR formulation sent in) -Increased long-acting insulin from 20 to 25 units daily -Check CBGs every morning before breakfast, keep a log, bring  it to next visit -Follow-up in 2 weeks -Plan to start either semaglutide or empagliflozin at next visit (patient preference for pills so we will likely start empagliflozin)  Encounter for smoking cessation counseling He is interested in starting medicine today that would help with smoking cessation.  We agreed to start Chantix today.  Side effects discussed. -Chantix starter pack sent to pharmacy -Quit date set for 02/21/2020   Tachycardia Possibly related to recent nicotine use.  Overall, he feels well.  Not concerned about infection.  Rhythm is regular and below 130 bpm so I have a low suspicion for A. fib.  We will monitor for now and recheck in next visit in 2 weeks.  Mr. Scherzer told me that the pharmacy called and there was confusion about one of his prescriptions.  It was unclear which prescription he was referring to.  His Metformin was resent to the pharmacy.  Bufford Buttner, MD [facility]

## 2020-01-21 NOTE — Patient Instructions (Signed)
It was great to see you today.  Here is a quick review of the things we talked about:   Diabetes: We are going to make 2 changes today and I would like to see you back in clinic in 2 weeks. -Increase your Metformin to 1000 mg (2 pills) in the morning and 1000 mg (2 pills) in the evening -Increase your insulin to 25 units daily -Check your blood glucose every morning before breakfast and write it down.  Bring this record with you to next appointment. -Come back to clinic in 2 weeks  Smoking cessation: I have sent in Chantix to your pharmacy.  Start taking the medicine as written.  We decided that your start date would be 02/21/2020.  Plan to throw out all of your cigarettes on that day.

## 2020-02-06 ENCOUNTER — Other Ambulatory Visit: Payer: Self-pay

## 2020-02-06 DIAGNOSIS — E1165 Type 2 diabetes mellitus with hyperglycemia: Secondary | ICD-10-CM

## 2020-02-06 MED ORDER — PEN NEEDLES 31G X 6 MM MISC: 1 refills | Status: AC

## 2020-02-17 ENCOUNTER — Other Ambulatory Visit: Payer: Self-pay

## 2020-02-17 DIAGNOSIS — E1165 Type 2 diabetes mellitus with hyperglycemia: Secondary | ICD-10-CM

## 2020-02-18 MED ORDER — GLIPIZIDE 10 MG PO TABS
10.0000 mg | ORAL_TABLET | Freq: Every day | ORAL | 0 refills | Status: DC
Start: 2020-02-18 — End: 2020-07-14

## 2020-02-18 MED ORDER — ATORVASTATIN CALCIUM 20 MG PO TABS
20.0000 mg | ORAL_TABLET | Freq: Every day | ORAL | 0 refills | Status: DC
Start: 2020-02-18 — End: 2020-04-22

## 2020-02-18 MED ORDER — TRESIBA FLEXTOUCH 100 UNIT/ML ~~LOC~~ SOPN: 10.0000 [IU] | PEN_INJECTOR | Freq: Every day | SUBCUTANEOUS | 0 refills | Status: DC

## 2020-02-18 MED ORDER — CONTOUR NEXT TEST VI STRP: ORAL_STRIP | 12 refills | Status: AC

## 2020-03-15 DIAGNOSIS — E118 Type 2 diabetes mellitus with unspecified complications: Secondary | ICD-10-CM | POA: Diagnosis not present

## 2020-04-17 NOTE — Progress Notes (Deleted)
NEUROLOGY FOLLOW UP OFFICE NOTE  Temple Sporer [id number]  Assessment/Plan:   1.  Left sided hemiparesis, likely secondary to stroke, possibly thalamocapsular due to small vessel disease. 2.  Type 2 diabetes mellitus, uncontrolled.  1.  Secondary stroke prevention as managed by PCP: -  ASA 79m daily - atorvastatin 254mdaily (LDL goal less than 70) - optimize glycemic control (Hgb A1c goal less than 7) - Blood pressure control  Subjective:  Samuel Maldonado a [age]ear old right-handed male with HTN, diabetes, and depression who follows up for ***  UPDATE: Current medications:  ASA 8152matorvastatin 43m50mlipizide, insulin, metformin, lisinopril  Seen in October.  Ordered MRI of brain and cardiac event monitor which were both never performed.    HISTORY: [facility] as left sided arm and leg weakness, vision loss in the left eye and headache. CT head and CTA of head and neck were unremarkable.  He received IV tPA.  Labs showed LDL 103 and Hgb A1c 8.  However, he left AMA before stroke workup could be completed.  Echocardiogram on 07/17/2019 showed EF 60-65% with no cardiac source of emboli.  Repeat CT head on 07/23/2019 was unremarkable.  He was initially on ASA and Plavix but subsequently stopped Plavix and continues on ASA 81mg51mly.  Repeat labs from September showed worsening Hgb A1c of 13.2  Since the stroke, he has been on disability.  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  Due to his uncontrolled diabetes, he has not been allowed to get DOT physical to assess ability to drive.    PAST MEDICAL HISTORY: Past Medical History:  Diagnosis Date  . Depression   . GERD (gastroesophageal reflux disease)   . Hypertension   . Stroke (HCC) [place] Type 2 diabetes mellitus (HCC)     MEDICATIONS: Current Outpatient Medications on File Prior to Visit   Medication Sig Dispense Refill  . acetaminophen (TYLENOL) 500 MG tablet Take 1,000 mg by mouth every 6 (six) hours as needed for headache.    . aspMarland Kitchenrin 81 MG EC tablet Take 1 tablet (81 mg total) by mouth daily. (Patient not taking: Reported on 12/15/2019) 21 tablet 0  . atorvastatin (LIPITOR) 20 MG tablet Take 1 tablet (20 mg total) by mouth daily. 90 tablet 0  . Blood Glucose Monitoring Suppl ([place] CONTOUR LINK 2.4) w/Device KIT Use to check once daily 1 kit 0  . clopidogrel (PLAVIX) 75 MG tablet Take 1 tablet (75 mg total) by mouth daily. (Patient not taking: Reported on 10/21/2019) 90 tablet 3  . gabapentin (NEURONTIN) 100 MG capsule Take 1 capsule (100 mg total) by mouth 3 (three) times daily. (Patient not taking: Reported on 10/21/2019) 90 capsule 3  . glipiZIDE (GLUCOTROL) 10 MG tablet Take 1 tablet (10 mg total) by mouth daily. 90 tablet 0  . glucose blood (CONTOUR NEXT TEST) test strip Use as instructed 100 each 12  . insulin degludec (TRESIBA FLEXTOUCH) 100 UNIT/ML FlexTouch Pen Inject 10 Units into the skin daily. Increase by 1 unit each day until blood glucose in morning reaches 100. 3 mL 0  . Insulin Pen Needle (PEN NEEDLES) 31G X 6 MM MISC Use to inject insulin once daily 100 each 1  . lisinopril (ZESTRIL) 10 MG tablet Take 1 tablet (10 mg total) by mouth daily. 90 tablet 3  .  metFORMIN (GLUCOPHAGE-XR) 500 MG 24 hr tablet TAKE 2 TABLETS BY MOUTH EVERY MORNING AND AT BEDTIME 180 tablet 3  . Microlet Lancets MISC Use to check blood sugar once daily in AM 100 each 1  . tamsulosin (FLOMAX) 0.4 MG CAPS capsule Take 1 capsule (0.4 mg total) by mouth daily. (Patient not taking: Reported on 10/21/2019) 60 capsule 3  . varenicline (CHANTIX STARTING MONTH PAK) 0.5 MG X 11 & 1 MG X 42 tablet Take one 0.5 mg tablet by mouth once daily for 3 days, then increase to one 0.5 mg tablet twice daily for 4 days, then increase to one 1 mg tablet twice daily. 53 tablet 0   No current facility-administered  medications on file prior to visit.    ALLERGIES: No Known Allergies  FAMILY HISTORY: Family History  Problem Relation Age of Onset  . Cancer Mother   . Kidney disease Mother   . Cancer Father   . Diabetes Sister   . Heart disease Sister   . Diabetes Brother       Objective:  *** General: No acute distress.  Patient appears ***-groomed.   Head:  Normocephalic/atraumatic Eyes:  Fundi examined but not visualized Neck: supple, no paraspinal tenderness, full range of motion Heart:  Regular rate and rhythm Lungs:  Clear to auscultation bilaterally Back: No paraspinal tenderness Neurological Exam: alert and oriented to person, place, and time. Attention span and concentration intact, recent and remote memory intact, fund of knowledge intact.  Speech fluent and not dysarthric, language intact.  CN II-XII intact. Bulk and tone normal, muscle strength 5/5 throughout.  Sensation to light touch, temperature and vibration intact.  Deep tendon reflexes 2+ throughout, toes downgoing.  Finger to nose and heel to shin testing intact.  Gait normal, Romberg negative.     Metta Clines, DO  CC: ***

## 2020-04-20 ENCOUNTER — Ambulatory Visit: Payer: Self-pay | Admitting: Neurology

## 2020-04-22 ENCOUNTER — Encounter: Payer: Self-pay | Admitting: Family Medicine

## 2020-04-22 ENCOUNTER — Other Ambulatory Visit: Payer: Self-pay

## 2020-04-22 ENCOUNTER — Ambulatory Visit (INDEPENDENT_AMBULATORY_CARE_PROVIDER_SITE_OTHER): Payer: Federal, State, Local not specified - PPO | Admitting: Family Medicine

## 2020-04-22 VITALS — BP 120/80 | HR 101 | Ht 68.0 in | Wt 225.8 lb

## 2020-04-22 DIAGNOSIS — I639 Cerebral infarction, unspecified: Secondary | ICD-10-CM

## 2020-04-22 DIAGNOSIS — E1165 Type 2 diabetes mellitus with hyperglycemia: Secondary | ICD-10-CM

## 2020-04-22 DIAGNOSIS — I10 Essential (primary) hypertension: Secondary | ICD-10-CM

## 2020-04-22 DIAGNOSIS — Z8673 Personal history of transient ischemic attack (TIA), and cerebral infarction without residual deficits: Secondary | ICD-10-CM | POA: Diagnosis not present

## 2020-04-22 DIAGNOSIS — Z794 Long term (current) use of insulin: Secondary | ICD-10-CM

## 2020-04-22 LAB — POCT GLYCOSYLATED HEMOGLOBIN (HGB A1C): HbA1c, POC (controlled diabetic range): 8.1 % — AB (ref 0.0–7.0)

## 2020-04-22 MED ORDER — ATORVASTATIN CALCIUM 20 MG PO TABS
40.0000 mg | ORAL_TABLET | Freq: Every day | ORAL | 0 refills | Status: DC
Start: 2020-04-22 — End: 2020-05-20

## 2020-04-22 MED ORDER — LISINOPRIL 10 MG PO TABS: 20.0000 mg | ORAL_TABLET | Freq: Every day | ORAL | 3 refills | Status: AC

## 2020-04-22 MED ORDER — SEMAGLUTIDE(0.25 OR 0.5MG/DOS) 2 MG/1.5ML ~~LOC~~ SOPN: 0.2500 mg | PEN_INJECTOR | SUBCUTANEOUS | 0 refills | Status: AC

## 2020-04-22 NOTE — Progress Notes (Addendum)
    SUBJECTIVE:   CHIEF COMPLAINT / HPI:   Diabetes Current medication includes: - Metformin 100 mg BID - glipizide 10 mg daily - atorvastatin 20 mg - lisinopril 10 mg He reports that he has not been using any insulin for roughly the past month.  He stopped using insulin because he found that his fasting CBGs were roughly 110-120.  His A1c today is 8.1.  Hypertension His current medication includes: -lisinopril 10 mg  Cholesterol His current medication includes: -Atorvastatin 20 mg He has a previous medical history significant for stroke    PERTINENT  PMH / PSH: Stroke within the past year  OBJECTIVE:   BP 120/80   Pulse (!) 101   Ht 5\' 8"  (1.727 m)   Wt 225 lb 12.8 oz (102.4 kg)   SpO2 98%   BMI 34.33 kg/m    General: Alert and cooperative and appears to be in no acute distress Extremities: No peripheral edema. Warm/ well perfused.  Strong radial pulses. Neuro: Cranial nerves grossly intact  Diabetic Foot Exam - Simple   Simple Foot Form Diabetic Foot exam was performed with the following findings: Yes 04/22/2020 10:23 AM  Visual Inspection No deformities, no ulcerations, no other skin breakdown bilaterally: Yes Sensation Testing Intact to touch and monofilament testing bilaterally: Yes Pulse Check Posterior Tibialis and Dorsalis pulse intact bilaterally: Yes Comments      ASSESSMENT/PLAN:   Type 2 diabetes mellitus with hyperglycemia (HCC) Significant improvement in his A1c today.  A1c 8.1 today down from 14.  He did stop insulin on his own without instructions from his physician.  Due to his significant A1c improvement, he was told to continue off of insulin for now.  We will start semaglutide today and have him follow-up in 1 month to ensure that is tolerable and increase his dose. -Continue Metformin and glipizide -Stop insulin -Start semaglutide -Follow-up in 1 month to assess tolerability and increased dose  Hypertension 120/80 today.  I would  prefer his diastolic pressure is a little bit lower given his past history of stroke. -Check BMP today -Increase lisinopril to 20 mg daily -Recheck BMP in 1-2 weeks -Return to clinic in 1 month for blood pressure check  History of stroke Atorvastatin currently at 20 mg daily.  Due to his past history of stroke he would benefit from a high intensity dose. -Increase atorvastatin to 40 mg daily     06/22/2020, MD [facility]

## 2020-04-22 NOTE — Assessment & Plan Note (Signed)
120/80 today.  I would prefer his diastolic pressure is a little bit lower given his past history of stroke. -Check BMP today -Increase lisinopril to 20 mg daily -Recheck BMP in 1-2 weeks -Return to clinic in 1 month for blood pressure check

## 2020-04-22 NOTE — Assessment & Plan Note (Deleted)
Atorvastatin currently at 20 mg daily.  Due to his past history of stroke he would benefit from a high intensity dose. -Increase atorvastatin to 40 mg daily 

## 2020-04-22 NOTE — Assessment & Plan Note (Signed)
Atorvastatin currently at 20 mg daily.  Due to his past history of stroke he would benefit from a high intensity dose. -Increase atorvastatin to 40 mg daily

## 2020-04-22 NOTE — Patient Instructions (Signed)
It was great to see you today.  Here is a quick review of the things we talked about:   Diabetes: Finger commands today.  Please remember, that it is important for you to be seen somewhat frequently until all of her medical problems are well controlled.  Today, your A1c was 8.1.  This is great.  Continue taking your Metformin and glipizide.  Going to add a new medication today called semaglutide.  This is a once weekly injection.  The common side effects are nausea, stomach upset and diarrhea.  I have sent this prescription to your pharmacy.  Please ask your pharmacist to explain the proper use of this medication.  Please come back to clinic in 1 month to make sure you are tolerating this new medication well and we will increase the dose at that time.  Please message me if this medication is too expensive.  Cholesterol: Lets increase your cholesterol medication to atorvastatin 40 mg today.  Hypertension: Your blood pressure looks good but I think it would be better for her to be a tiny bit lower.  Were going to increase your lisinopril to 20 mg daily.  Going to get blood work today and again in 2 weeks.   If all of your labs are normal, I will send you a message over my chart or send you a letter.  If there is anything to discuss, I will give you a phone call.

## 2020-04-22 NOTE — Assessment & Plan Note (Addendum)
Significant improvement in his A1c today.  A1c 8.1 today down from 14.  He did stop insulin on his own without instructions from his physician.  Due to his significant A1c improvement, he was told to continue off of insulin for now.  We will start semaglutide today and have him follow-up in 1 month to ensure that is tolerable and increase his dose. -Continue Metformin and glipizide -Stop insulin -Start semaglutide -Follow-up in 1 month to assess tolerability and increased dose

## 2020-04-23 LAB — BASIC METABOLIC PANEL
BUN/Creatinine Ratio: 11 (ref 9–20)
BUN: 15 mg/dL (ref 6–24)
CO2: 22 mmol/L (ref 20–29)
Calcium: 9.8 mg/dL (ref 8.7–10.2)
Chloride: 98 mmol/L (ref 96–106)
Creatinine, Ser: 1.32 mg/dL — ABNORMAL HIGH (ref 0.76–1.27)
Glucose: 116 mg/dL — ABNORMAL HIGH (ref 65–99)
Potassium: 4.3 mmol/L (ref 3.5–5.2)
Sodium: 138 mmol/L (ref 134–144)
eGFR: 67 mL/min/{1.73_m2} (ref >59)

## 2020-05-20 ENCOUNTER — Other Ambulatory Visit: Payer: Self-pay | Admitting: Family Medicine

## 2020-06-21 DIAGNOSIS — Z7984 Long term (current) use of oral hypoglycemic drugs: Secondary | ICD-10-CM | POA: Diagnosis not present

## 2020-06-21 DIAGNOSIS — Z833 Family history of diabetes mellitus: Secondary | ICD-10-CM | POA: Diagnosis not present

## 2020-06-21 DIAGNOSIS — R3589 Other polyuria: Secondary | ICD-10-CM | POA: Diagnosis not present

## 2020-06-21 DIAGNOSIS — Z8249 Family history of ischemic heart disease and other diseases of the circulatory system: Secondary | ICD-10-CM | POA: Diagnosis not present

## 2020-06-21 DIAGNOSIS — E1165 Type 2 diabetes mellitus with hyperglycemia: Secondary | ICD-10-CM | POA: Diagnosis not present

## 2020-06-21 DIAGNOSIS — F17210 Nicotine dependence, cigarettes, uncomplicated: Secondary | ICD-10-CM | POA: Diagnosis not present

## 2020-07-12 DIAGNOSIS — E1165 Type 2 diabetes mellitus with hyperglycemia: Secondary | ICD-10-CM | POA: Diagnosis not present

## 2020-07-12 DIAGNOSIS — E860 Dehydration: Secondary | ICD-10-CM | POA: Diagnosis not present

## 2020-07-12 DIAGNOSIS — B372 Candidiasis of skin and nail: Secondary | ICD-10-CM | POA: Diagnosis not present

## 2020-07-12 DIAGNOSIS — Z7984 Long term (current) use of oral hypoglycemic drugs: Secondary | ICD-10-CM | POA: Diagnosis not present

## 2020-07-12 DIAGNOSIS — R42 Dizziness and giddiness: Secondary | ICD-10-CM | POA: Diagnosis not present

## 2020-07-14 ENCOUNTER — Encounter: Payer: Self-pay | Admitting: Family Medicine

## 2020-07-14 ENCOUNTER — Other Ambulatory Visit: Payer: Self-pay

## 2020-07-14 ENCOUNTER — Ambulatory Visit (INDEPENDENT_AMBULATORY_CARE_PROVIDER_SITE_OTHER): Payer: Federal, State, Local not specified - PPO | Admitting: Family Medicine

## 2020-07-14 VITALS — BP 121/93 | HR 117 | Ht 68.0 in | Wt 210.4 lb

## 2020-07-14 DIAGNOSIS — I10 Essential (primary) hypertension: Secondary | ICD-10-CM

## 2020-07-14 DIAGNOSIS — E1165 Type 2 diabetes mellitus with hyperglycemia: Secondary | ICD-10-CM | POA: Diagnosis not present

## 2020-07-14 DIAGNOSIS — B379 Candidiasis, unspecified: Secondary | ICD-10-CM

## 2020-07-14 DIAGNOSIS — N400 Enlarged prostate without lower urinary tract symptoms: Secondary | ICD-10-CM

## 2020-07-14 DIAGNOSIS — R5381 Other malaise: Secondary | ICD-10-CM

## 2020-07-14 DIAGNOSIS — R5383 Other fatigue: Secondary | ICD-10-CM

## 2020-07-14 DIAGNOSIS — Z20822 Contact with and (suspected) exposure to covid-19: Secondary | ICD-10-CM

## 2020-07-14 DIAGNOSIS — Y93E6 Activity, residential relocation: Secondary | ICD-10-CM

## 2020-07-14 LAB — POCT GLYCOSYLATED HEMOGLOBIN (HGB A1C): HbA1c, POC (controlled diabetic range): 14.2 % — AB (ref 0.0–7.0)

## 2020-07-14 MED ORDER — TAMSULOSIN HCL 0.4 MG PO CAPS: 0.4000 mg | ORAL_CAPSULE | Freq: Every day | ORAL | 3 refills | Status: AC

## 2020-07-14 MED ORDER — ATORVASTATIN CALCIUM 40 MG PO TABS: 40.0000 mg | ORAL_TABLET | Freq: Every day | ORAL | 3 refills | Status: AC

## 2020-07-14 MED ORDER — TRESIBA FLEXTOUCH 100 UNIT/ML ~~LOC~~ SOPN
10.0000 [IU] | PEN_INJECTOR | Freq: Every day | SUBCUTANEOUS | 3 refills | Status: DC
Start: 2020-07-14 — End: 2020-07-15

## 2020-07-14 MED ORDER — GLIPIZIDE 10 MG PO TABS: 10.0000 mg | ORAL_TABLET | Freq: Every day | ORAL | 3 refills | Status: AC

## 2020-07-14 MED ORDER — METFORMIN HCL ER (MOD) 1000 MG PO TB24: ORAL_TABLET | ORAL | 3 refills | Status: DC

## 2020-07-14 NOTE — Progress Notes (Signed)
SUBJECTIVE:   CHIEF COMPLAINT / HPI:   Follow-up for diabetes: patient seen 6/26 at [facility] in [place] for elevated CBG as high as 600, BHBA was also elevated at 9.  In the ED he received 2 L of normal saline and 10 units of NovoLog.  He is currently taking Metformin 1000mg  BID and Glipizide. He is prescribed ozempic, however is not currently taking it.  Patient used to be on Tresiba 10 units however is no longer taking this.  Many of his medications were stopped because his diabetes was so well controlled.02-21-1978  He feels that he needs something stronger like Lantus.   He is asking for refill of his medicines (glipizide, Lipitor, [name]; he already has Ozempic at home).  BPH: Patient reports he pees frequently during the night and has a history of prostate issues.  He is asking for refill of his Flomax.  Patient's urinary frequency could also be due to his currently poorly controlled diabetes.  Concern for yeast infection: Patient is concerned for yeast infection on his penis, possibly contributing is his recently poorly controlled diabetes.  Patient received fluconazole in the emergency department at his recent visit.  Malaise: Patient reports feeling "off" as if he is sick, he is concerned that he has COVID and would like a COVID test at today's visit.  No upper respiratory symptoms or fevers.  Elevated CBG could be contributing to his symptoms.  HTN: BP today 121/93, is currenty taking Lisinopril 20mg  daily.   Relocating: Patient used to live in [place] however is relocating to [place] due to his work.  He does not currently have a PCP in [place].  Patient received a list of family medicine physicians he could contact in [place] to establish care there.    Sodium 133 (L) 135 - 153 mmol/L CHEMISTRY ANALYSIS   07/12/2020 10:07 AM EDT [facility]    Potassium 4.6 3.5 - 5.3 mmol/L CHEMISTRY ANALYSIS   07/12/2020 10:07 AM EDT  [facility]    CO2 23 (L) 24 - 31 mmol/L CHEMISTRY ANALYSIS   07/12/2020 10:07 AM EDT [facility]    Chloride 94 (L) 98 - 110 mmol/L CHEMISTRY ANALYSIS   07/12/2020 10:07 AM EDT [facility]    Glucose 689 (HH) 75 - 110 mg/dL CHEMISTRY ANALYSIS   07/14/2020 10:07 AM EDT [facility]    Calcium 10.4 8.5 - 10.4 mg/dL CHEMISTRY ANALYSIS   07/14/2020 10:07 AM EDT [facility]    Creatinine 1.3 0.5 - 1.4 mg/dL CHEMISTRY ANALYSIS   09/38/1829 10:07 AM EDT [facility]    BUN 27 (H) 8 - 21 mg/dL CHEMISTRY ANALYSIS   93/71/6967 10:07 AM EDT [facility]    Bilirubin, Total 0.6 0.2 - 1.2 mg/dL CHEMISTRY ANALYSIS   89/38/1017 10:07 AM EDT [facility]    Total Protein 8.2 (H) 6.0 - 8.0 g/dL CHEMISTRY ANALYSIS   51/02/5850 10:07 AM EDT [facility]    Anion Gap 16 5 - 16 mmol/L CHEMISTRY ANALYSIS   07/12/2020 10:07 AM EDT [facility]    Osmolality Calculation 304.0 (H) 267.0 - 293.0 mOsm/Kg CHEMISTRY ANALYSIS   07/12/2020 10:07 AM EDT [facility]    Globulin 3.6 2.2 - 4.0 g/dL CHEMISTRY ANALYSIS   07/14/2020 10:07 AM EDT [facility]    BUN/Creatinine Ratio 21 (H) 6 - 20 . CHEMISTRY ANALYSIS   07/12/2020 10:07 AM EDT [facility]    Estimated GFR 76.22 mL/min CHEMISTRY ANALYSIS   07/12/2020 10:07 AM EDT [facility]    Comment: GFR >= 60 mL/min/1.71m2 does not exclude kidney disease.  A/G Ratio 1.3 0.7 - 2.0 CHEMISTRY ANALYSIS   07/12/2020 10:07 AM EDT [facility]    ALP 103 33 - 120 IU/L CHEMISTRY ANALYSIS   07/12/2020 10:07 AM EDT [facility]    AST (SGOT) 9 (L)  10 - 40 IU/L CHEMISTRY ANALYSIS   07/12/2020 10:07 AM EDT [facility]    ALT 13 3 - 36 IU/L CHEMISTRY ANALYSIS   07/12/2020 10:07 AM EDT [facility]    Albumin 4.6 3.1 - 4.7 g/dL CHEMISTRY ANALYSIS   22/48/2500 10:07 AM EDT [facility]     Active and Recently Administered Medications - Scheduled Scheduled Medication Order 07/10/2020 07/11/2020 07/12/2020  fluconazole (DIFLUCAN) tablet 200 mg (COMPLETED) 200 mg, oral, Once, On Sun 07/12/20 at 1220, For 1 dose    1320Given^06/26 1320^Provider: Kizzie Bane, RN^Dose: 200 mg^Route: oral   insulin regular (NovoLIN) injection 10 Units (COMPLETED) Sun 07/12/20 at 1020, For 1 dose, Obtain accucheck 30 min after single dose      1033Given^06/26 1033^Provider: Kizzie Bane, RN^Dose: 10 Units^Route: intravenous   1,000 mL, intravenous, at 999 mL/hr, Administer over 1 Hours, Once, On Sun 07/12/20 at 0940, For 1 dose     1010New Bag^06/26 1010^Provider: Kizzie Bane, RN^Dose: 1,000 mL^Rate: 999 mL/hr^Route: intravenous  1,000 mL, intravenous, at 999 mL/hr, Administer over 1 Hours, Once, On Sun 07/12/20 at 1040, For 1 dose     1129New Bag^06/26 1129^Provider: Kizzie Bane, RN^Dose: 1,000 mL^Rate: 999 mL/hr^Route: intravenous    PERTINENT  PMH / PSH: Type 2 diabetes, hypertension, hyperlipidemia, BPH  OBJECTIVE:   BP (!) 121/93   Pulse (!) 117   Ht 5\' 8"  (1.727 m)   Wt 210 lb 6.4 oz (95.4 kg)   SpO2 94%   BMI 31.99 kg/m    Physical exam: General: Very pleasant patient, appears somewhat fatigued Respiratory: Comfortable work of breathing on room air  ASSESSMENT/PLAN:   Hypertension Blood pressure today well controlled. -Continue lisinopril  Type 2 diabetes mellitus with hyperglycemia [facility]) Patient with recent ED visit with CBG 600+.  A1c checked at today's visit greater than 14%. -Patient to continue glipizide 10 mg daily -Patient represcribed Tresiba 10 units  daily -Continue metformin 1000 mg extended release once daily -Patient encouraged to start his Ozempic injection once weekly -Continue losartan for renal protection and atorvastatin  Prostatic hyperplasia Patient with significant symptoms most likely acutely exacerbated by uncontrolled diabetes with frequent urination during the night. -Restarting Flomax 0.4 mg daily  Malaise and fatigue Patient with sensation of malaise and fatigue, occasional headaches.  Symptoms started when his CBGs became very elevated.  No other sick symptoms or fevers at this time.  Vital signs stable. -Patient swabbed for COVID at today's visit, we will follow-up with results -Symptoms will most likely improve as his diabetes becomes under better control.  Recent relocation to unfamiliar city Patient relocating to [place] due to his work. -Patient was given list of primary care doctors he could reach out to to get established in [place] -In the meantime I encouraged him to follow-up at an urgent care  center closer to home to recheck his CBGs and receive additional insulin if he needed (patient has never been on short acting insulin before and would not want to start him on this without significant teaching).  Suspected 2019 novel coronavirus infection Patient with symptoms of malaise would like to rule out COVID as cause of symptoms. -COVID test performed at today's visit, we will follow-up with results  Yeast infection Patient with yeast infection of his penis most recently exacerbated by elevated CBGs. -Patient already treated with fluconazole at his most recent ED visit     Dollene Cleveland, DO [facility]

## 2020-07-14 NOTE — Patient Instructions (Signed)
Thank you for coming in to see Samuel Maldonado today! Please see below to review our plan for today's visit:  1. For diabetes: Start taking Tresiba 10 units once daily. Take 1 tablet of Metformin 1000mg  once daily. Also take Glipizide 10mg  daily. Use Semaglutide 0.25 1 injection once weekly. Continue to check your blood sugars and record them - bring all recordings to your next doctor.  2. Increase Atorvastatin/lipitor to 40mg  daily.  3. You have been represcribed Flomax.     Please call the clinic at [phone] if your symptoms worsen or you have any concerns. It was our pleasure to serve you!   Dr. [facility]

## 2020-07-15 ENCOUNTER — Other Ambulatory Visit: Payer: Self-pay | Admitting: Family Medicine

## 2020-07-15 ENCOUNTER — Other Ambulatory Visit: Payer: Self-pay

## 2020-07-15 DIAGNOSIS — Y93E6 Activity, residential relocation: Secondary | ICD-10-CM | POA: Insufficient documentation

## 2020-07-15 DIAGNOSIS — Z20822 Contact with and (suspected) exposure to covid-19: Secondary | ICD-10-CM | POA: Insufficient documentation

## 2020-07-15 DIAGNOSIS — B379 Candidiasis, unspecified: Secondary | ICD-10-CM | POA: Insufficient documentation

## 2020-07-15 DIAGNOSIS — R5381 Other malaise: Secondary | ICD-10-CM | POA: Insufficient documentation

## 2020-07-15 DIAGNOSIS — E1165 Type 2 diabetes mellitus with hyperglycemia: Secondary | ICD-10-CM

## 2020-07-15 LAB — NOVEL CORONAVIRUS, NAA: SARS-CoV-2, NAA: NOT DETECTED

## 2020-07-15 LAB — SARS-COV-2, NAA 2 DAY TAT

## 2020-07-15 MED ORDER — METFORMIN HCL ER (MOD) 1000 MG PO TB24: ORAL_TABLET | ORAL | 3 refills | Status: DC

## 2020-07-15 MED ORDER — TRESIBA FLEXTOUCH 100 UNIT/ML ~~LOC~~ SOPN: 10.0000 [IU] | PEN_INJECTOR | Freq: Every day | SUBCUTANEOUS | 3 refills | Status: DC

## 2020-07-15 NOTE — Assessment & Plan Note (Signed)
Patient relocating to [place] due to his work. -Patient was given list of primary care doctors he could reach out to to get established in [place] -In the meantime I encouraged him to follow-up at an urgent care center closer to home to recheck his CBGs and receive additional insulin if he needed (patient has never been on short acting insulin before and would not want to start him on this without significant teaching).

## 2020-07-15 NOTE — Assessment & Plan Note (Signed)
Patient with sensation of malaise and fatigue, occasional headaches.  Symptoms started when his CBGs became very elevated.  No other sick symptoms or fevers at this time.  Vital signs stable. -Patient swabbed for COVID at today's visit, we will follow-up with results -Symptoms will most likely improve as his diabetes becomes under better control.

## 2020-07-15 NOTE — Assessment & Plan Note (Signed)
Patient with recent ED visit with CBG 600+.  A1c checked at today's visit greater than 14%. -Patient to continue glipizide 10 mg daily -Patient represcribed Tresiba 10 units daily -Continue metformin 1000 mg extended release once daily -Patient encouraged to start his Ozempic injection once weekly -Continue losartan for renal protection and atorvastatin

## 2020-07-15 NOTE — Assessment & Plan Note (Signed)
Patient with significant symptoms most likely acutely exacerbated by uncontrolled diabetes with frequent urination during the night. -Restarting Flomax 0.4 mg daily

## 2020-07-15 NOTE — Assessment & Plan Note (Signed)
Patient with yeast infection of his penis most recently exacerbated by elevated CBGs. -Patient already treated with fluconazole at his most recent ED visit

## 2020-07-15 NOTE — Assessment & Plan Note (Signed)
Blood pressure today well controlled. -Continue lisinopril

## 2020-07-15 NOTE — Assessment & Plan Note (Signed)
Patient with symptoms of malaise would like to rule out COVID as cause of symptoms. -COVID test performed at today's visit, we will follow-up with results

## 2020-07-16 ENCOUNTER — Encounter: Payer: Self-pay | Admitting: Family Medicine

## 2020-07-18 DIAGNOSIS — E860 Dehydration: Secondary | ICD-10-CM | POA: Diagnosis not present

## 2020-07-18 DIAGNOSIS — E1165 Type 2 diabetes mellitus with hyperglycemia: Secondary | ICD-10-CM | POA: Diagnosis not present

## 2020-07-18 DIAGNOSIS — R739 Hyperglycemia, unspecified: Secondary | ICD-10-CM | POA: Diagnosis not present

## 2020-07-18 DIAGNOSIS — F17210 Nicotine dependence, cigarettes, uncomplicated: Secondary | ICD-10-CM | POA: Diagnosis not present

## 2020-08-10 ENCOUNTER — Other Ambulatory Visit: Payer: Self-pay | Admitting: *Deleted

## 2020-08-10 NOTE — Telephone Encounter (Signed)
Patient states that he needs a refill on his tresiba and lisinopril.  I informed that his tresiba should still have 2 refills on it and that he just needs to call his pharmacy.  Lisinopril request sent to his PCP.  Chrisa Hassan,CMA

## 2020-08-12 NOTE — Telephone Encounter (Signed)
Patient's CMP was abnormal per recent ED visit. I called him to schedule a lab visit ASAP to repeat labs. He is out of town until next Friday (08/21/20) but said he would call once he's back in town for a lab visit. Will hold on refilling Lisinipril until labs come back

## 2020-09-16 DIAGNOSIS — Z20822 Contact with and (suspected) exposure to covid-19: Secondary | ICD-10-CM | POA: Diagnosis not present

## 2020-09-16 DIAGNOSIS — R079 Chest pain, unspecified: Secondary | ICD-10-CM | POA: Diagnosis not present

## 2020-12-15 DIAGNOSIS — R519 Headache, unspecified: Secondary | ICD-10-CM | POA: Diagnosis not present

## 2020-12-15 DIAGNOSIS — I10 Essential (primary) hypertension: Secondary | ICD-10-CM | POA: Diagnosis not present

## 2020-12-15 DIAGNOSIS — Z79899 Other long term (current) drug therapy: Secondary | ICD-10-CM | POA: Diagnosis not present

## 2020-12-15 DIAGNOSIS — E119 Type 2 diabetes mellitus without complications: Secondary | ICD-10-CM | POA: Diagnosis not present

## 2020-12-15 DIAGNOSIS — Z5321 Procedure and treatment not carried out due to patient leaving prior to being seen by health care provider: Secondary | ICD-10-CM | POA: Diagnosis not present

## 2020-12-15 DIAGNOSIS — Z7984 Long term (current) use of oral hypoglycemic drugs: Secondary | ICD-10-CM | POA: Diagnosis not present

## 2020-12-15 DIAGNOSIS — G44209 Tension-type headache, unspecified, not intractable: Secondary | ICD-10-CM | POA: Diagnosis not present

## 2020-12-15 DIAGNOSIS — F17210 Nicotine dependence, cigarettes, uncomplicated: Secondary | ICD-10-CM | POA: Diagnosis not present

## 2020-12-25 ENCOUNTER — Other Ambulatory Visit: Payer: Self-pay | Admitting: Family Medicine

## 2021-01-01 ENCOUNTER — Ambulatory Visit: Payer: Federal, State, Local not specified - PPO | Admitting: Student

## 2021-01-12 ENCOUNTER — Telehealth: Payer: Self-pay

## 2021-01-12 MED ORDER — METFORMIN HCL ER (MOD) 1000 MG PO TB24: 1000.0000 mg | ORAL_TABLET | Freq: Every day | ORAL | 0 refills | Status: AC

## 2021-01-12 NOTE — Telephone Encounter (Signed)
Received fax from pharmacy that Metformin (Fortamet) was requiring PA. Patient was previously on metformin 1,000 mg (Glumetza)  Please advise if this can be sent to pharmacy, or if there is clinical reason for change.   Thanks.   Veronda Prude, RN

## 2021-01-20 ENCOUNTER — Other Ambulatory Visit (HOSPITAL_COMMUNITY): Payer: Self-pay

## 2021-01-22 NOTE — Progress Notes (Signed)
A Prior Authorization was initiated for this patients Glumetza (Metformin XR 1000mg ) through CoverMyMeds.   Key: 

## 2021-02-02 NOTE — Progress Notes (Signed)
A second Prior Authorization was initiated for this patients GLUMETZA through CoverMyMeds. First PA was closed with no result.  ZOX:[id number]

## 2021-02-10 NOTE — Progress Notes (Signed)
PA was closed by [employer] via CoverMyMeds 02/02/21. Outcome "N/A".  Reason: We were unable to process your request due to conflicting information regarding continuation of treatment. Our records do not show that the patient has had a prior approval for this medication within the past 6 months.

## 2021-06-22 ENCOUNTER — Encounter: Payer: Self-pay | Admitting: *Deleted

## 2022-01-04 IMAGING — CT CT ANGIO HEAD
4 of 23 series · 14 of 47 positions shown · IV contrast (APPLIED)
Comparison: None.

CLINICAL DATA: Slurred speech and headache. Left-sided vision
disturbance. Code stroke.

EXAM:
CT HEAD WITHOUT CONTRAST
CT ANGIOGRAPHY OF THE HEAD AND NECK
TECHNIQUE: Contiguous axial images were obtained from the base of the skull
through the vertex without intravenous contrast. Multidetector CT
imaging of the head and neck was performed using the standard
protocol during bolus administration of intravenous contrast.
Multiplanar CT image reconstructions and MIPs were obtained to
evaluate the vascular anatomy. Carotid stenosis measurements (when
applicable) are obtained utilizing NASCET criteria, using the distal
internal carotid diameter as the denominator.

[Series 5: arterial 1 · axial · arterial · 0.57mm/px · z∈[-136,-22]mm · 2 of 173 slices shown]
[im 58/173  brain]
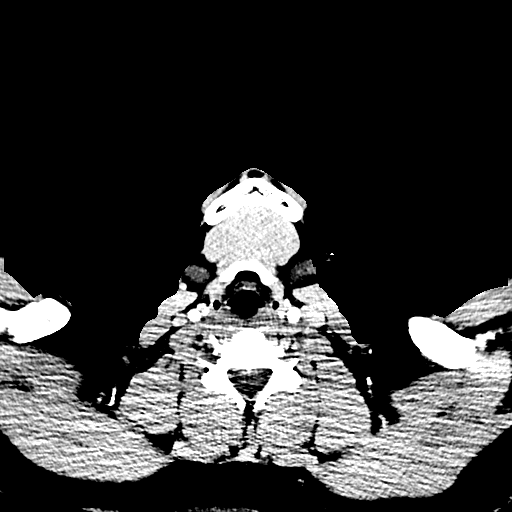
[im 115/173  brain]
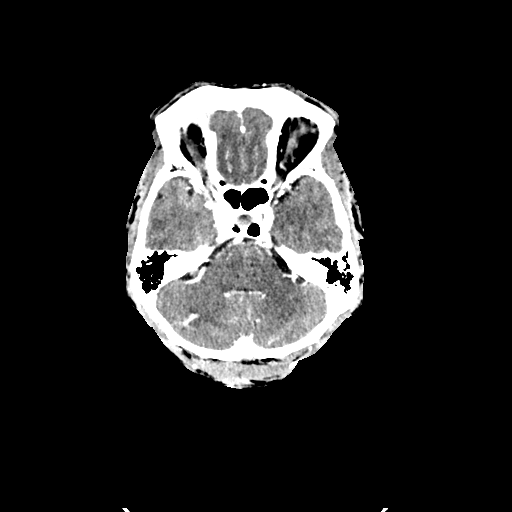

[Series 7: ax thin · axial · 0.39mm/px · z∈[-166,-82]mm · 2 of 338 slices shown]
[im 85/338  brain]
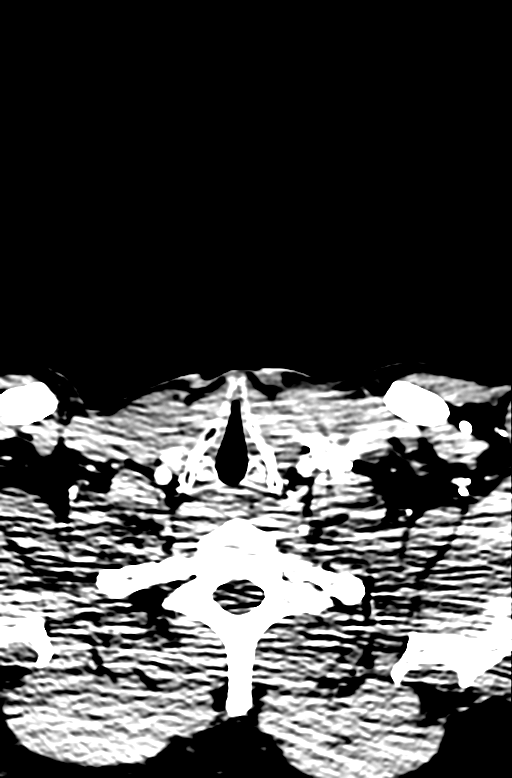
[im 169/338  brain]
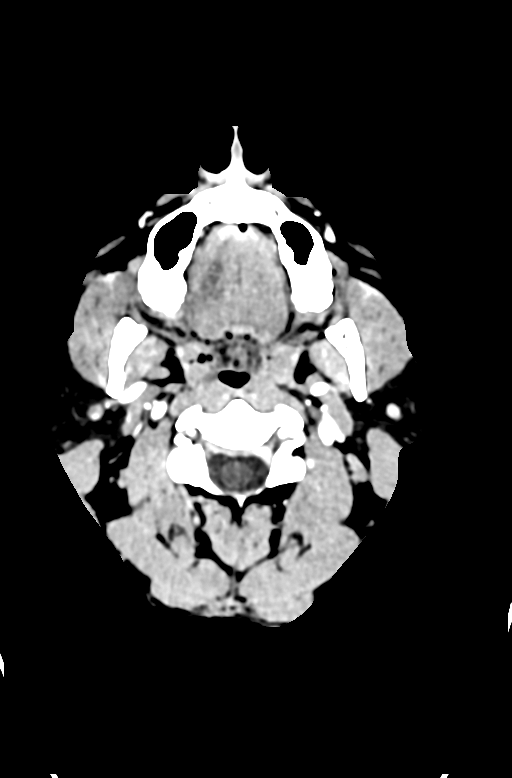

[Series 23: arterial 1 thin · axial · arterial · 0.45mm/px · z∈[-213,+53]mm · 8 of 686 slices shown (1 of 2)]
[im 77/686  brain]
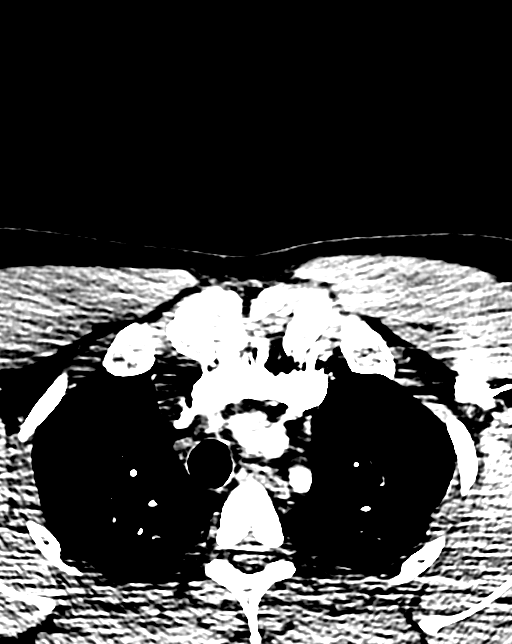
[im 153/686  bone]
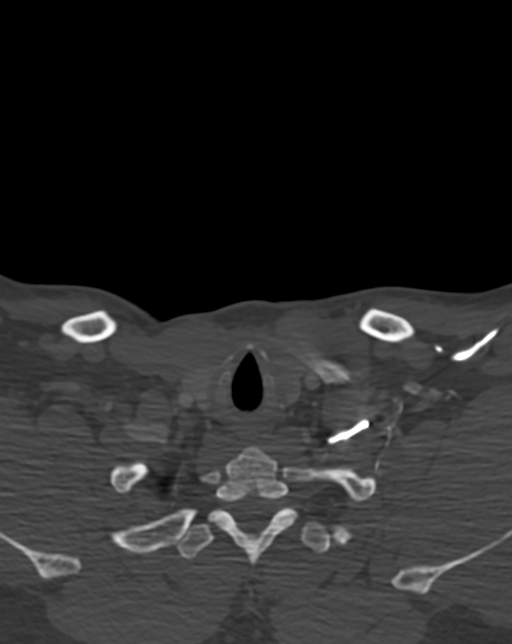
[im 229/686  brain]
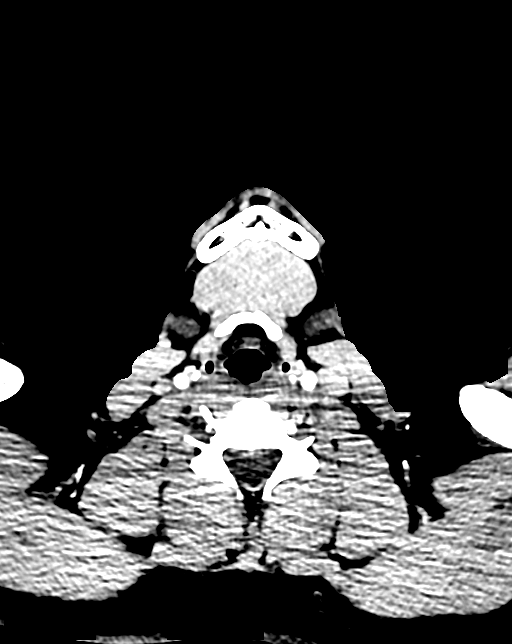
[im 305/686  bone]
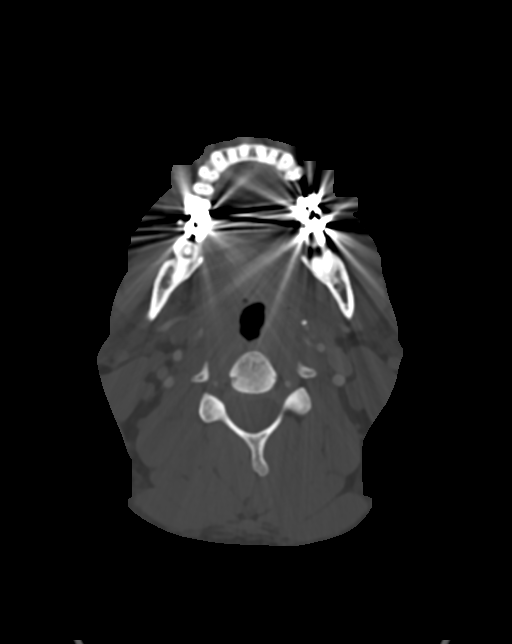
[im 381/686  brain]
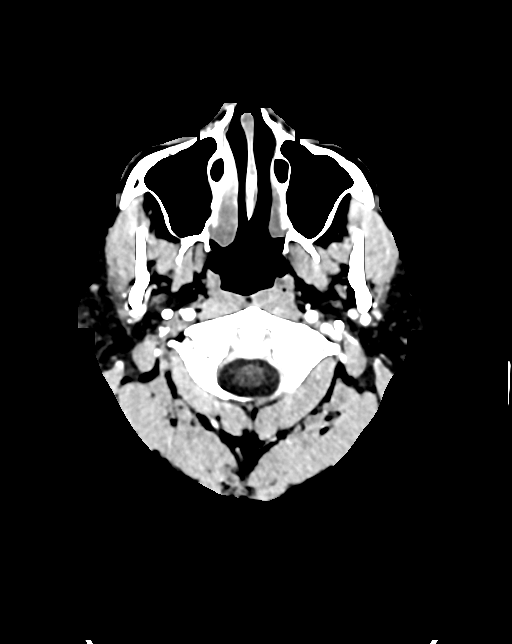
[im 457/686  bone]
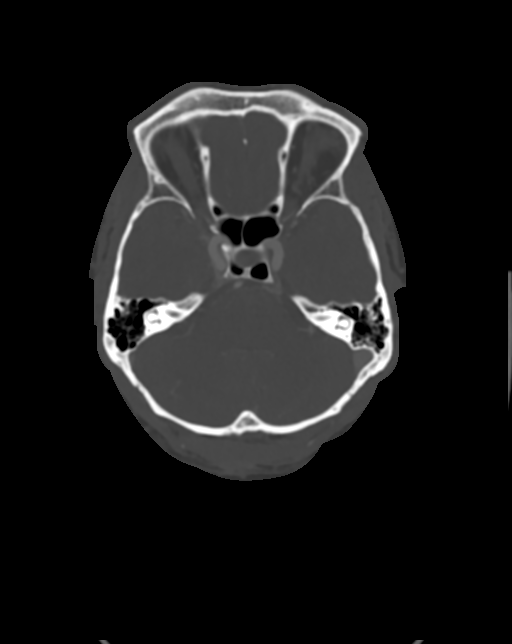
[im 533/686  brain]
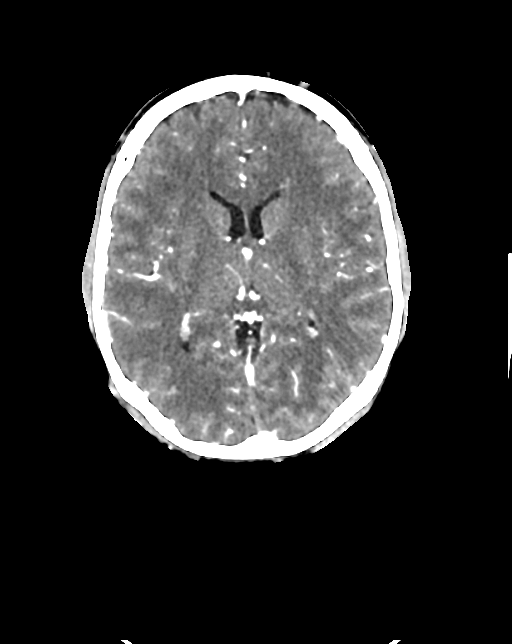
[im 609/686  bone]
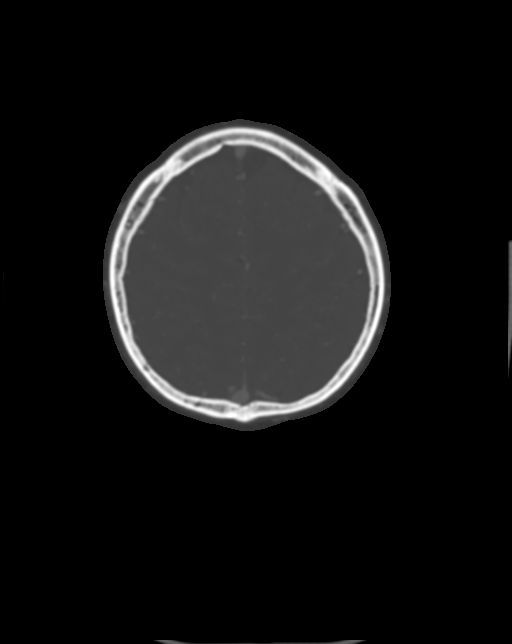

[Series 24: arterial 1 thin · coronal · arterial · 0.45mm/px · 2 of 563 slices shown (2 of 2)]
[im 188/563  brain]
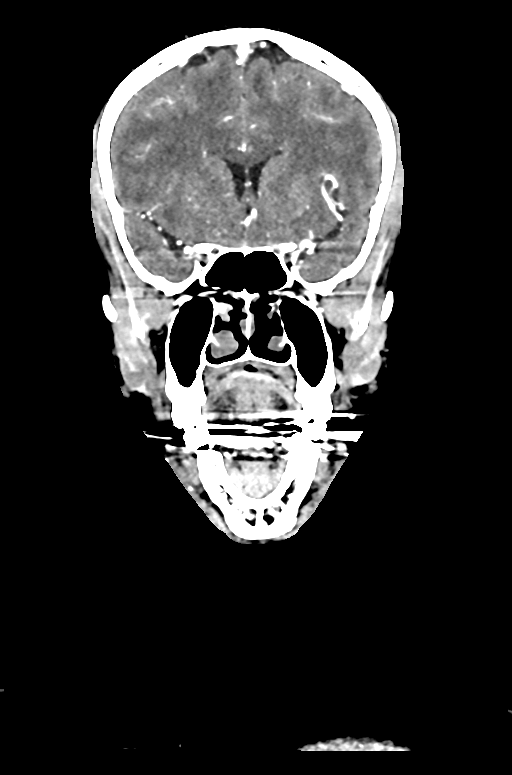
[im 375/563  brain]
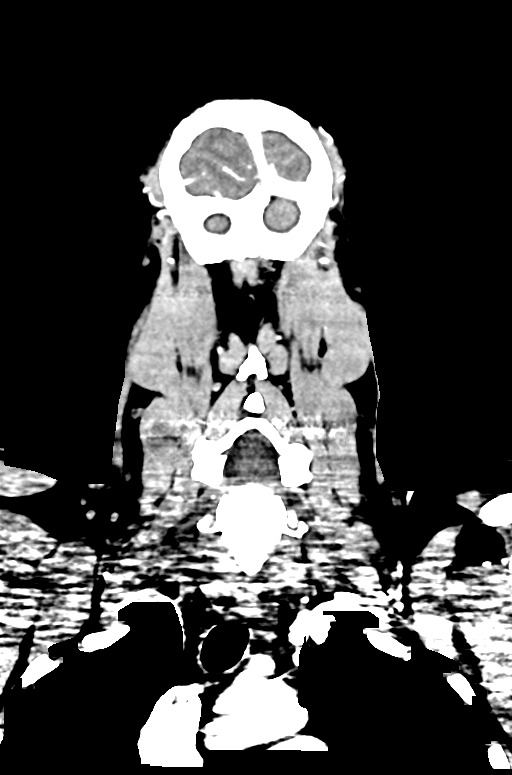

[14 of 47 positions shown; findings below may reference images not displayed]

FINDINGS: CT HEAD FINDINGS

Brain: There is no mass, hemorrhage or extra-axial collection. The
size and configuration of the ventricles and extra-axial CSF spaces
are normal. The brain parenchyma is normal, without evidence of
acute or chronic infarction.

Vascular: No abnormal hyperdensity of the major intracranial
arteries or dural venous sinuses. No intracranial atherosclerosis.

Skull: The visualized skull base, calvarium and extracranial soft
tissues are normal.

Sinuses/Orbits: No fluid levels or advanced mucosal thickening of
the visualized paranasal sinuses. No mastoid or middle ear effusion.
The orbits are normal.

ASPECTS (Alberta Stroke Program Early CT Score)

- Ganglionic level infarction (caudate, lentiform nuclei, internal
capsule, insula, M1-M3 cortex): 7

- Supraganglionic infarction (M4-M6 cortex): 3

Total score (0-10 with 10 being normal): 10

CTA NECK FINDINGS

SKELETON: There is no bony spinal canal stenosis. No lytic or
blastic lesion.

OTHER NECK: Normal pharynx, larynx and major salivary glands. No
cervical lymphadenopathy. Unremarkable thyroid gland.

UPPER CHEST: No pneumothorax or pleural effusion. No nodules or
masses.

AORTIC ARCH:

There is no calcific atherosclerosis of the aortic arch. There is no
aneurysm, dissection or hemodynamically significant stenosis of the
visualized portion of the aorta. Conventional 3 vessel aortic
branching pattern. The visualized proximal subclavian arteries are
widely patent.

RIGHT CAROTID SYSTEM: Normal without aneurysm, dissection or
stenosis.

LEFT CAROTID SYSTEM: Normal without aneurysm, dissection or
stenosis.

VERTEBRAL ARTERIES: Left dominant configuration. Both origins are
clearly patent. There is no dissection, occlusion or flow-limiting
stenosis to the skull base (V1-V3 segments).

CTA HEAD FINDINGS

POSTERIOR CIRCULATION:

--Vertebral arteries: Normal V4 segments.

--Inferior cerebellar arteries: Normal.

--Basilar artery: Normal.

--Superior cerebellar arteries: Normal.

--Posterior cerebral arteries (PCA): Normal.

ANTERIOR CIRCULATION:

--Intracranial internal carotid arteries: Normal.

--Anterior cerebral arteries (ACA): Normal. Both A1 segments are
present. Patent anterior communicating artery (a-comm).

--Middle cerebral arteries (MCA): Normal.

VENOUS SINUSES: As permitted by contrast timing, patent.

ANATOMIC VARIANTS: None

Review of the MIP images confirms the above findings.
IMPRESSION: 1. No intracranial hemorrhage. ASPECTS is 10.
2. No emergent large vessel occlusion or high-grade stenosis of the
head or neck.

These results were communicated to Dr. Deniz Boch at [DATE] on 07/08/2019 by text page via the [employer] messaging system.

## 2022-01-19 IMAGING — CT CT HEAD W/O CM
1 series · 16 of 30 positions shown, 20 images · non-contrast
Comparison: July 08, 2019

CLINICAL DATA: Recent tPA administration for neurologic deficit

EXAM:
CT HEAD WITHOUT CONTRAST
TECHNIQUE: Contiguous axial images were obtained from the base of the skull
through the vertex without intravenous contrast.

[Series 2: head w/(date) · axial · 0.49mm/px · z∈[-174,-14]mm · 16 of 36 slices shown, 20 images]
[im 2/36  brain]
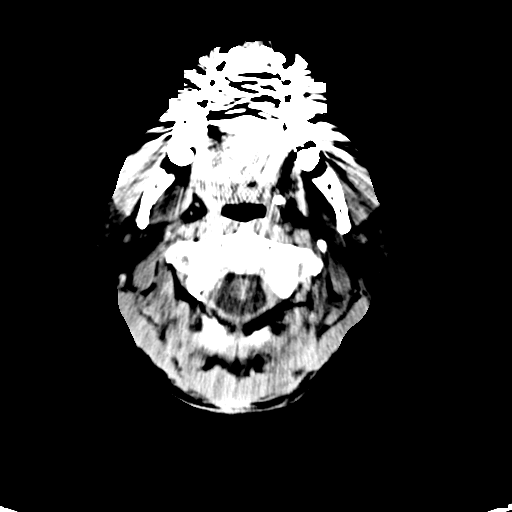
[im 2/36  bone]
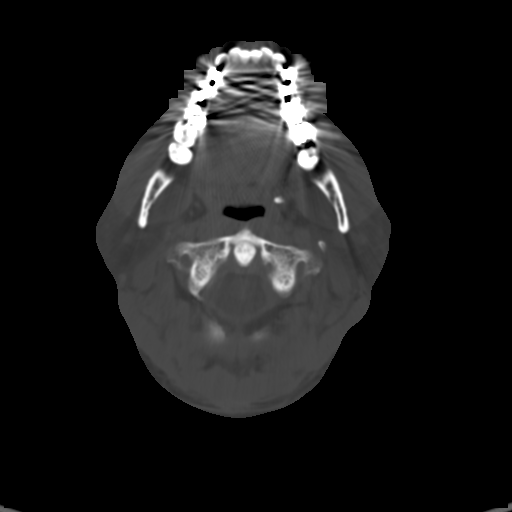
[im 4/36  brain]
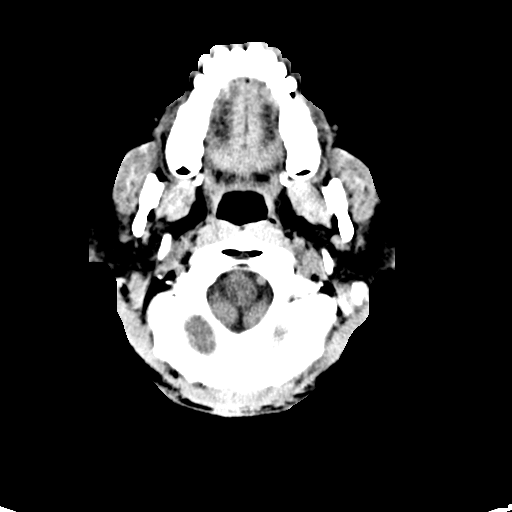
[im 7/36  brain]
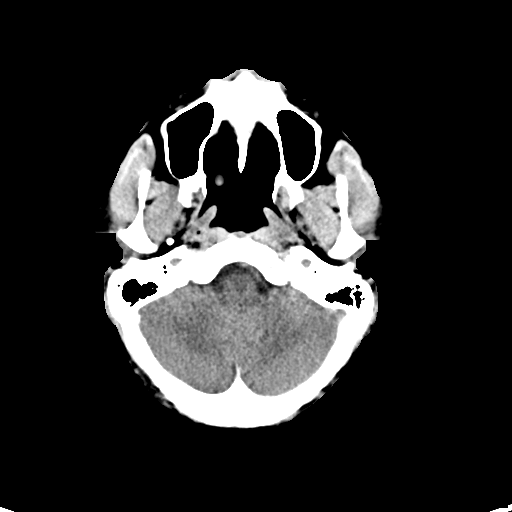
[im 9/36  brain]
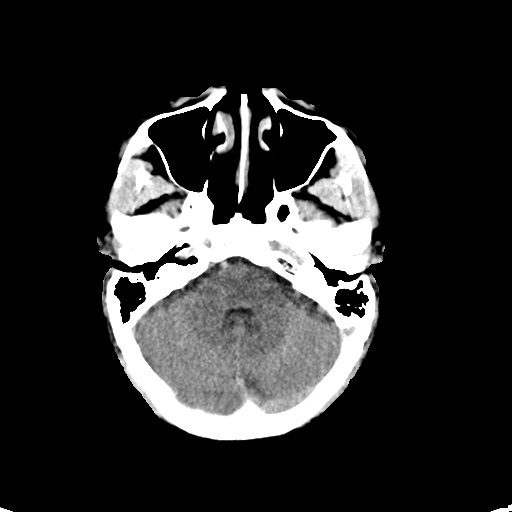
[im 10/36  brain]
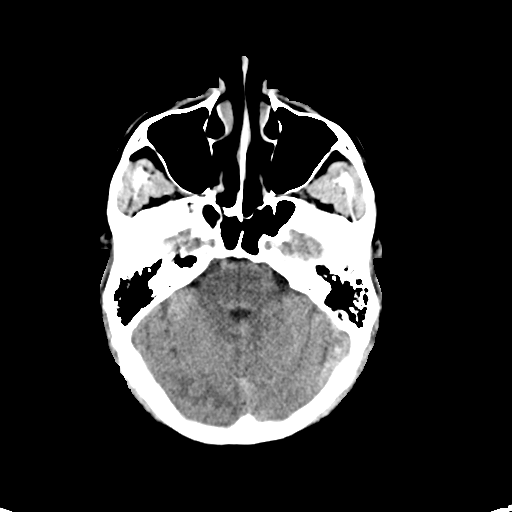
[im 10/36  bone]
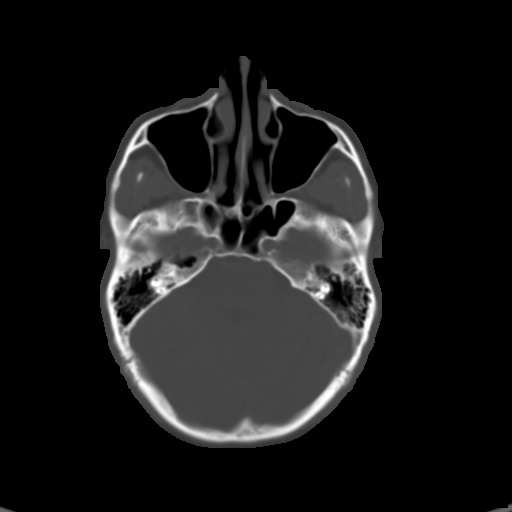
[im 13/36  brain]
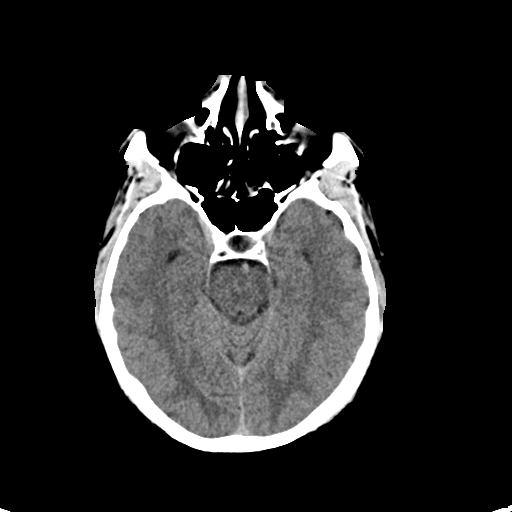
[im 15/36  brain]
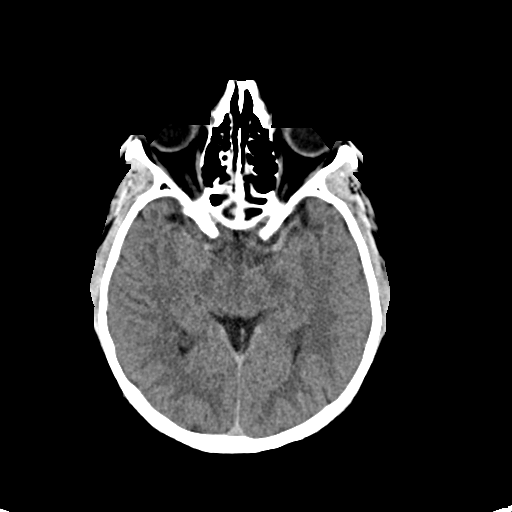
[im 17/36  brain]
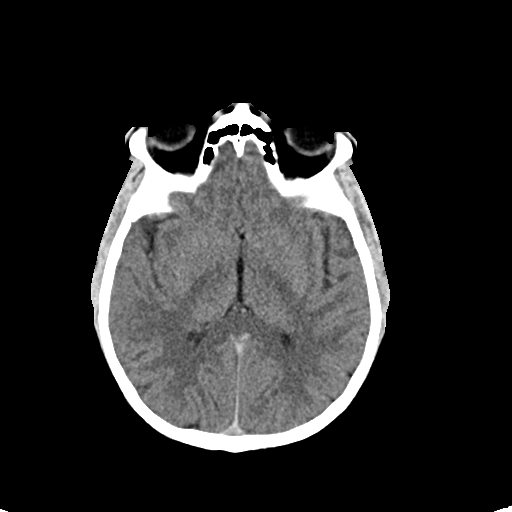
[im 19/36  brain]
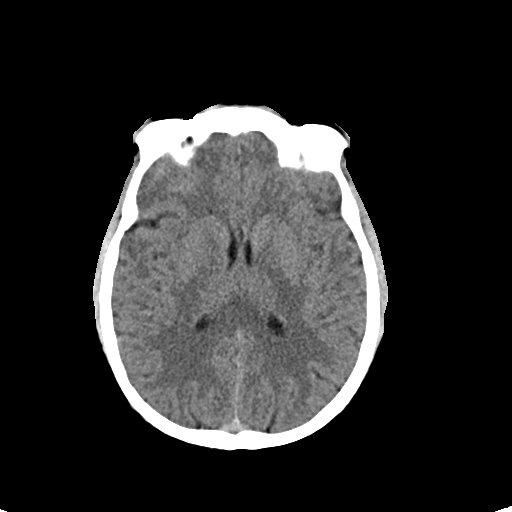
[im 19/36  bone]
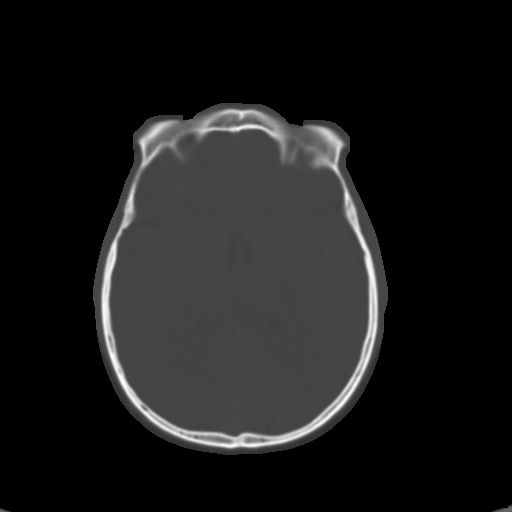
[im 21/36  brain]
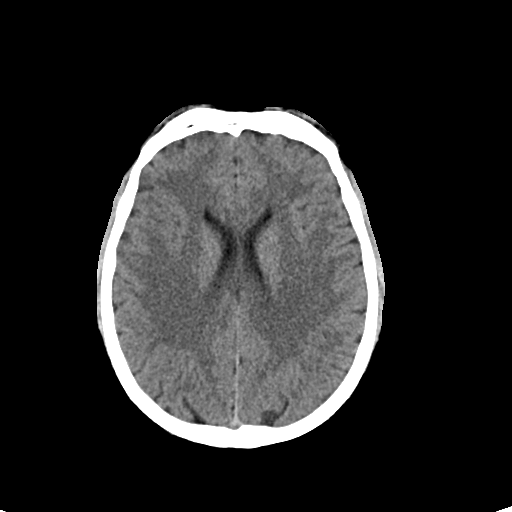
[im 23/36  brain]
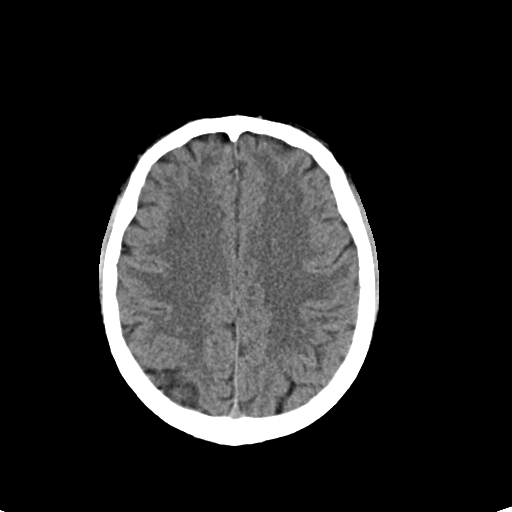
[im 26/36  brain]
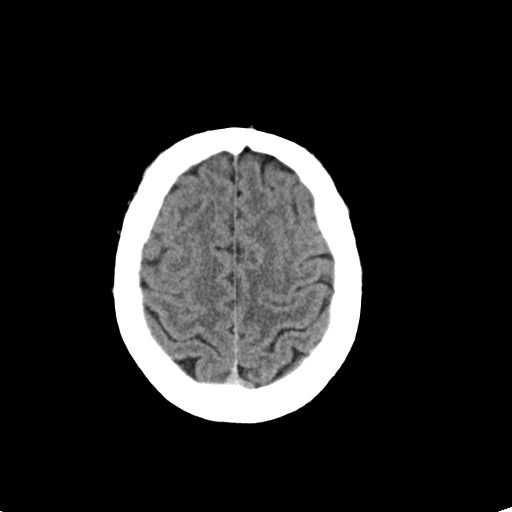
[im 27/36  brain]
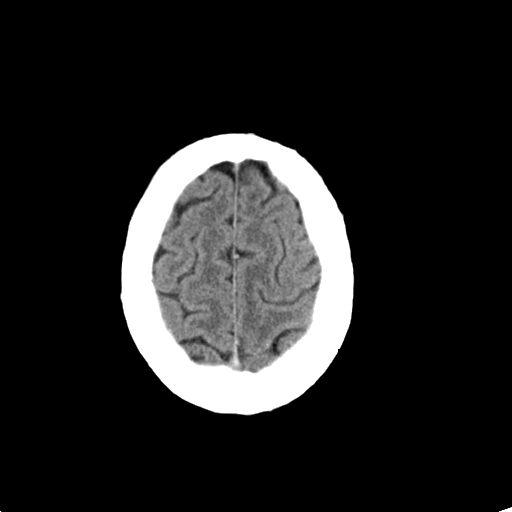
[im 27/36  bone]
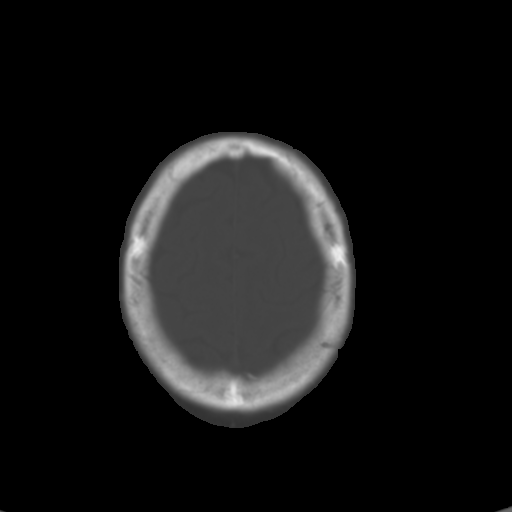
[im 29/36  brain]
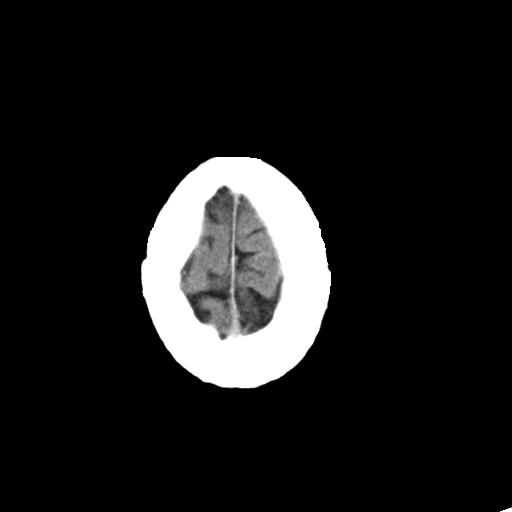
[im 32/36  brain]
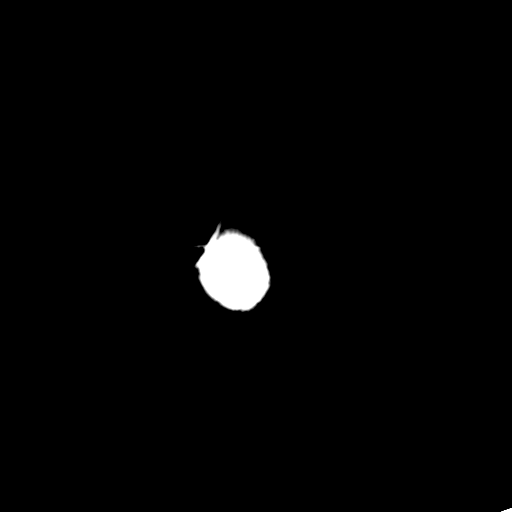
[im 34/36  brain]
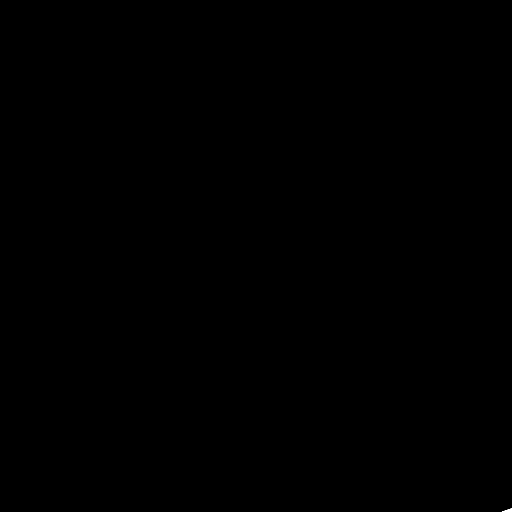

[16 of 30 positions shown; findings below may reference images not displayed]

FINDINGS: Brain: Ventricles and sulci are normal in size and configuration.
There is slight invagination of CSF into the sella, stable. There is
no evident intracranial mass, hemorrhage, extra-axial fluid
collection, or midline shift. Brain parenchyma appears unremarkable.
No acute appearing infarct evident.

Vascular: No hyperdense vessel. No appreciable vascular
calcification.

Skull: The bony calvarium appears intact.

Sinuses/Orbits: There is mucosal thickening in several ethmoid air
cells. Other paranasal sinuses are clear. Orbits appear symmetric
bilaterally.

Other: Mastoid air cells are clear.
IMPRESSION: Normal appearing brain parenchyma.  No mass or hemorrhage.

Mucosal thickening noted in several ethmoid air cells.

## 2022-03-28 ENCOUNTER — Telehealth: Payer: Self-pay

## 2022-03-28 NOTE — Transitions of Care (Post Inpatient/ED Visit) (Unsigned)
   03/28/2022  Name: Samuel Maldonado MRN: [id number] DOB: [birth year]  Today's TOC FU Call Status: Today's TOC FU Call Status:: Unsuccessul Call (1st Attempt) Unsuccessful Call (1st Attempt) Date: 03/28/22  Attempted to reach the patient regarding the most recent Inpatient/ED visit.  Follow Up Plan: Additional outreach attempts will be made to reach the patient to complete the Transitions of Care (Post Inpatient/ED visit) call.   Signature Juanda Crumble, [place] Direct Dial [phone]

## 2022-03-29 NOTE — Transitions of Care (Post Inpatient/ED Visit) (Unsigned)
   03/29/2022  Name: Samuel Maldonado MRN: [id number] DOB: 1975/04/18  Today's TOC FU Call Status: Today's TOC FU Call Status:: Unsuccessful Call (2nd Attempt) Unsuccessful Call (1st Attempt) Date: 03/28/22 Unsuccessful Call (2nd Attempt) Date: 03/29/22  Attempted to reach the patient regarding the most recent Inpatient/ED visit.  Follow Up Plan: Additional outreach attempts will be made to reach the patient to complete the Transitions of Care (Post Inpatient/ED visit) call.   Signature Juanda Crumble, [place] Direct Dial [phone]

## 2022-03-30 NOTE — Transitions of Care (Post Inpatient/ED Visit) (Signed)
   03/30/2022  Name: Samuel Maldonado MRN: [id number] DOB: 1975-06-03  Today's TOC FU Call Status: Today's TOC FU Call Status:: Unsuccessful Call (3rd Attempt) Unsuccessful Call (1st Attempt) Date: 03/28/22 Unsuccessful Call (2nd Attempt) Date: 03/29/22 Unsuccessful Call (3rd Attempt) Date: 03/30/22  Attempted to reach the patient regarding the most recent Inpatient/ED visit.  Follow Up Plan: No further outreach attempts will be made at this time. We have been unable to contact the patient.  Signature Juanda Crumble, [place] Direct Dial [phone]
